# Patient Record
Sex: Female | Born: 1995 | Race: White | Hispanic: No | Marital: Single | Smoking: Never smoker
Health system: Southern US, Community
[De-identification: ages and names within clinical notes are randomized; demographics above are authoritative.]

## PROBLEM LIST (undated history)

## (undated) DIAGNOSIS — A4902 Methicillin resistant Staphylococcus aureus infection, unspecified site: Secondary | ICD-10-CM

## (undated) DIAGNOSIS — F419 Anxiety disorder, unspecified: Secondary | ICD-10-CM

## (undated) DIAGNOSIS — IMO0001 Reserved for inherently not codable concepts without codable children: Secondary | ICD-10-CM

## (undated) DIAGNOSIS — F329 Major depressive disorder, single episode, unspecified: Secondary | ICD-10-CM

## (undated) DIAGNOSIS — F909 Attention-deficit hyperactivity disorder, unspecified type: Secondary | ICD-10-CM

## (undated) DIAGNOSIS — F840 Autistic disorder: Secondary | ICD-10-CM

## (undated) DIAGNOSIS — K219 Gastro-esophageal reflux disease without esophagitis: Secondary | ICD-10-CM

## (undated) HISTORY — PX: MASTOIDECTOMY: SHX711

## (undated) HISTORY — DX: Gastro-esophageal reflux disease without esophagitis: K21.9

## (undated) HISTORY — DX: Autistic disorder: F84.0

## (undated) HISTORY — DX: Attention-deficit hyperactivity disorder, unspecified type: F90.9

## (undated) HISTORY — DX: Reserved for inherently not codable concepts without codable children: IMO0001

## (undated) HISTORY — DX: Anxiety disorder, unspecified: F41.9

## (undated) HISTORY — DX: Major depressive disorder, single episode, unspecified: F32.9

## (undated) HISTORY — DX: Methicillin resistant Staphylococcus aureus infection, unspecified site: A49.02

---
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
## 2017-01-21 ENCOUNTER — Ambulatory Visit (INDEPENDENT_AMBULATORY_CARE_PROVIDER_SITE_OTHER): Payer: Medicaid Other | Admitting: Family Medicine

## 2017-01-21 ENCOUNTER — Encounter: Payer: Self-pay | Admitting: Family Medicine

## 2017-01-21 VITALS — BP 110/80 | Temp 98.2°F | Ht 61.5 in | Wt 152.0 lb

## 2017-01-21 DIAGNOSIS — R5383 Other fatigue: Secondary | ICD-10-CM

## 2017-01-21 DIAGNOSIS — G43109 Migraine with aura, not intractable, without status migrainosus: Secondary | ICD-10-CM

## 2017-01-21 DIAGNOSIS — Z23 Encounter for immunization: Secondary | ICD-10-CM

## 2017-01-21 DIAGNOSIS — B351 Tinea unguium: Secondary | ICD-10-CM

## 2017-01-21 DIAGNOSIS — Z79899 Other long term (current) drug therapy: Secondary | ICD-10-CM | POA: Diagnosis not present

## 2017-01-21 MED ORDER — SUMATRIPTAN SUCCINATE 50 MG PO TABS: ORAL_TABLET | ORAL | 8 refills | Status: DC

## 2017-01-21 NOTE — Patient Instructions (Signed)
Migraine Headache A migraine headache is an intense, throbbing pain on one side or both sides of the head. Migraines may also cause other symptoms, such as nausea, vomiting, and sensitivity to light and noise. What are the causes? Doing or taking certain things may also trigger migraines, such as:  Alcohol.  Smoking.  Medicines, such as: ? Medicine used to treat chest pain (nitroglycerine). ? Birth control pills. ? Estrogen pills. ? Certain blood pressure medicines.  Aged cheeses, chocolate, or caffeine.  Foods or drinks that contain nitrates, glutamate, aspartame, or tyramine.  Physical activity.  Other things that may trigger a migraine include:  Menstruation.  Pregnancy.  Hunger.  Stress, lack of sleep, too much sleep, or fatigue.  Weather changes.  What increases the risk? The following factors may make you more likely to experience migraine headaches:  Age. Risk increases with age.  Family history of migraine headaches.  Being Caucasian.  Depression and anxiety.  Obesity.  Being a woman.  Having a hole in the heart (patent foramen ovale) or other heart problems.  What are the signs or symptoms? The main symptom of this condition is pulsating or throbbing pain. Pain may:  Happen in any area of the head, such as on one side or both sides.  Interfere with daily activities.  Get worse with physical activity.  Get worse with exposure to bright lights or loud noises.  Other symptoms may include:  Nausea.  Vomiting.  Dizziness.  General sensitivity to bright lights, loud noises, or smells.  Before you get a migraine, you may get warning signs that a migraine is developing (aura). An aura may include:  Seeing flashing lights or having blind spots.  Seeing bright spots, halos, or zigzag lines.  Having tunnel vision or blurred vision.  Having numbness or a tingling feeling.  Having trouble talking.  Having muscle weakness.  How is this  diagnosed? A migraine headache can be diagnosed based on:  Your symptoms.  A physical exam.  Tests, such as CT scan or MRI of the head. These imaging tests can help rule out other causes of headaches.  Taking fluid from the spine (lumbar puncture) and analyzing it (cerebrospinal fluid analysis, or CSF analysis).  How is this treated? A migraine headache is usually treated with medicines that:  Relieve pain.  Relieve nausea.  Prevent migraines from coming back.  Treatment may also include:  Acupuncture.  Lifestyle changes like avoiding foods that trigger migraines.  Follow these instructions at home: Medicines  Take over-the-counter and prescription medicines only as told by your health care provider.  Do not drive or use heavy machinery while taking prescription pain medicine.  To prevent or treat constipation while you are taking prescription pain medicine, your health care provider may recommend that you: ? Drink enough fluid to keep your urine clear or pale yellow. ? Take over-the-counter or prescription medicines. ? Eat foods that are high in fiber, such as fresh fruits and vegetables, whole grains, and beans. ? Limit foods that are high in fat and processed sugars, such as fried and sweet foods. Lifestyle  Avoid alcohol use.  Do not use any products that contain nicotine or tobacco, such as cigarettes and e-cigarettes. If you need help quitting, ask your health care provider.  Get at least 8 hours of sleep every night.  Limit your stress. General instructions   Keep a journal to find out what may trigger your migraine headaches. For example, write down: ? What you eat and   drink. ? How much sleep you get. ? Any change to your diet or medicines.  If you have a migraine: ? Avoid things that make your symptoms worse, such as bright lights. ? It may help to lie down in a dark, quiet room. ? Do not drive or use heavy machinery. ? Ask your health care provider  what activities are safe for you while you are experiencing symptoms.  Keep all follow-up visits as told by your health care provider. This is important. Contact a health care provider if:  You develop symptoms that are different or more severe than your usual migraine symptoms. Get help right away if:  Your migraine becomes severe.  You have a fever.  You have a stiff neck.  You have vision loss.  Your muscles feel weak or like you cannot control them.  You start to lose your balance often.  You develop trouble walking.  You faint. This information is not intended to replace advice given to you by your health care provider. Make sure you discuss any questions you have with your health care provider. Document Released: 03/25/2005 Document Revised: 10/13/2015 Document Reviewed: 09/11/2015 Elsevier Interactive Patient Education  2017 [employer]   

## 2017-01-21 NOTE — Progress Notes (Signed)
   Subjective:    Patient ID: Rebekah Haynes, female    DOB: 1995/10/06, 21 y.o.   MRN: [id number]  Migraine   This is a new problem. The current episode started more than 1 month ago. The problem occurs intermittently. The problem has been waxing and waning. The pain is located in the bilateral region. The pain does not radiate. The pain quality is not similar to prior headaches. The quality of the pain is described as throbbing. The pain is at a severity of 7/10. The pain is moderate. Associated symptoms include nausea, phonophobia and photophobia. Pertinent negatives include no abdominal pain, vomiting or weakness. The symptoms are aggravated by noise. She has tried acetaminophen for the symptoms. The treatment provided mild relief.   Patient here today report her toenails have fungus on them and need treatment. Has put the otc antifungal creams on it and another otc medication that she painted on the nails, she states it was helping,but she ran out of medication. Her Grandmother is with her today and states the toe nails look better.   Patient also with significant fatigue tiredness denies any high fever chills sweats denies nausea vomiting diarrhea. 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  Gastrointestinal: Positive for nausea. Negative for abdominal pain and vomiting.  Neurological: Negative for weakness.   25 minutes was spent with the patient. Greater than half the time was spent in discussion and answering questions and counseling regarding the issues that the patient came in for today.     Objective:   Physical Exam  Constitutional: She appears well-developed and well-nourished. No distress.  HENT:  Head: Normocephalic and atraumatic.  Eyes: Right eye exhibits no discharge. Left eye exhibits no discharge.  Neck: No tracheal deviation  present.  Cardiovascular: Normal rate, regular rhythm and normal heart sounds.   No murmur heard. Pulmonary/Chest: Effort normal and breath sounds normal. No respiratory distress. She has no wheezes. She has no rales.  Musculoskeletal: She exhibits no edema.  Lymphadenopathy:    She has no cervical adenopathy.  Neurological: She is alert. She exhibits normal muscle tone.  Skin: Skin is warm and dry. No erythema.  Psychiatric: Her behavior is normal.  Vitals reviewed.   She does have toenail fungus noted. Neurologically she is normal      Assessment & Plan:  Migraines frequent with are recommend Imitrex on a regular basis if ongoing troubles follow-up recommend follow-up in several weeks with headache calendar  Significant fatigue tiredness I think it's related to her age recommend patient set a good schedule check lab work await the results  Patient does have what appears to be fungus and her nails I recommend that she make some clippings and send those in

## 2017-01-22 LAB — BASIC METABOLIC PANEL
BUN / CREAT RATIO: 16 (ref 9–23)
BUN: 11 mg/dL (ref 6–20)
CO2: 22 mmol/L (ref 20–29)
CREATININE: 0.7 mg/dL (ref 0.57–1.00)
Calcium: 9.4 mg/dL (ref 8.7–10.2)
Chloride: 100 mmol/L (ref 96–106)
GFR, EST AFRICAN AMERICAN: 145 mL/min/{1.73_m2} (ref >59)
GFR, EST NON AFRICAN AMERICAN: 126 mL/min/{1.73_m2} (ref >59)
Glucose: 99 mg/dL (ref 65–99)
Potassium: 4.7 mmol/L (ref 3.5–5.2)
SODIUM: 139 mmol/L (ref 134–144)

## 2017-01-22 LAB — CBC WITH DIFFERENTIAL/PLATELET
BASOS ABS: 0 10*3/uL (ref 0.0–0.2)
Basos: 0 %
EOS (ABSOLUTE): 0.3 10*3/uL (ref 0.0–0.4)
Eos: 3 %
HEMOGLOBIN: 12.9 g/dL (ref 11.1–15.9)
Hematocrit: 38.5 % (ref 34.0–46.6)
IMMATURE GRANS (ABS): 0 10*3/uL (ref 0.0–0.1)
Immature Granulocytes: 0 %
LYMPHS ABS: 2.9 10*3/uL (ref 0.7–3.1)
LYMPHS: 24 %
MCH: 27.2 pg (ref 26.6–33.0)
MCHC: 33.5 g/dL (ref 31.5–35.7)
MCV: 81 fL (ref 79–97)
MONOCYTES: 7 %
Monocytes Absolute: 0.8 10*3/uL (ref 0.1–0.9)
Neutrophils Absolute: 7.9 10*3/uL — ABNORMAL HIGH (ref 1.4–7.0)
Neutrophils: 66 %
Platelets: 425 10*3/uL — ABNORMAL HIGH (ref 150–379)
RBC: 4.75 x10E6/uL (ref 3.77–5.28)
RDW: 13.2 % (ref 12.3–15.4)
WBC: 11.9 10*3/uL — AB (ref 3.4–10.8)

## 2017-01-22 LAB — HEPATIC FUNCTION PANEL
ALK PHOS: 90 IU/L (ref 39–117)
ALT: 17 IU/L (ref 0–32)
AST: 14 IU/L (ref 0–40)
Albumin: 4.6 g/dL (ref 3.5–5.5)
BILIRUBIN, DIRECT: 0.08 mg/dL (ref 0.00–0.40)
Bilirubin Total: <0.2 mg/dL (ref 0.0–1.2)
Total Protein: 7.6 g/dL (ref 6.0–8.5)

## 2017-01-22 LAB — TSH: TSH: 1.66 u[IU]/mL (ref 0.450–4.500)

## 2017-01-22 LAB — T4, FREE: FREE T4: 1.13 ng/dL (ref 0.93–1.60)

## 2017-01-22 NOTE — Progress Notes (Signed)
Headache logs mailed to patient

## 2017-02-17 ENCOUNTER — Telehealth: Payer: Self-pay | Admitting: Family Medicine

## 2017-02-17 DIAGNOSIS — D72829 Elevated white blood cell count, unspecified: Secondary | ICD-10-CM

## 2017-02-17 NOTE — Telephone Encounter (Signed)
Based on her previous lab work repeat CBC is all she needs thank you

## 2017-02-17 NOTE — Telephone Encounter (Signed)
Mom called stating that they was told that the pt needed labs done before her appt. Last labs per epic were: tsh,t4, hepatic,bmp,and cbc on 01/21/17.

## 2017-02-18 ENCOUNTER — Ambulatory Visit: Payer: Medicaid Other | Admitting: Family Medicine

## 2017-02-18 NOTE — Telephone Encounter (Signed)
Spoke with patient's mother and informed her per Dr.Scott Luking- a repeat CBC is the only lab work that she will need. Patient's mother verbalized understanding.

## 2017-02-20 LAB — CBC WITH DIFFERENTIAL/PLATELET
BASOS: 0 %
Basophils Absolute: 0 10*3/uL (ref 0.0–0.2)
EOS (ABSOLUTE): 0.1 10*3/uL (ref 0.0–0.4)
EOS: 2 %
HEMATOCRIT: 39.6 % (ref 34.0–46.6)
Hemoglobin: 12.9 g/dL (ref 11.1–15.9)
Immature Grans (Abs): 0 10*3/uL (ref 0.0–0.1)
Immature Granulocytes: 0 %
LYMPHS ABS: 2.1 10*3/uL (ref 0.7–3.1)
Lymphs: 27 %
MCH: 27.6 pg (ref 26.6–33.0)
MCHC: 32.6 g/dL (ref 31.5–35.7)
MCV: 85 fL (ref 79–97)
Monocytes Absolute: 0.7 10*3/uL (ref 0.1–0.9)
Monocytes: 9 %
Neutrophils Absolute: 4.9 10*3/uL (ref 1.4–7.0)
Neutrophils: 62 %
Platelets: 452 10*3/uL — ABNORMAL HIGH (ref 150–379)
RBC: 4.67 x10E6/uL (ref 3.77–5.28)
RDW: 13.4 % (ref 12.3–15.4)
WBC: 7.8 10*3/uL (ref 3.4–10.8)

## 2017-03-06 ENCOUNTER — Ambulatory Visit (INDEPENDENT_AMBULATORY_CARE_PROVIDER_SITE_OTHER): Payer: Medicaid Other | Admitting: Family Medicine

## 2017-03-06 ENCOUNTER — Encounter: Payer: Self-pay | Admitting: Family Medicine

## 2017-03-06 VITALS — BP 118/78 | Ht 61.5 in | Wt 154.8 lb

## 2017-03-06 DIAGNOSIS — B351 Tinea unguium: Secondary | ICD-10-CM | POA: Diagnosis not present

## 2017-03-06 DIAGNOSIS — Z79899 Other long term (current) drug therapy: Secondary | ICD-10-CM

## 2017-03-06 DIAGNOSIS — G43109 Migraine with aura, not intractable, without status migrainosus: Secondary | ICD-10-CM

## 2017-03-06 MED ORDER — TERBINAFINE HCL 250 MG PO TABS: 250.0000 mg | ORAL_TABLET | Freq: Every day | ORAL | 2 refills | Status: DC

## 2017-03-06 NOTE — Progress Notes (Signed)
   Subjective:    Patient ID: Rebekah Haynes, female    DOB: 02/25/1996, 21 y.o.   MRN: [id number]  HPI  Patient arrives for a follow up on toe nail fungus and to discuss results of recent culture. This patient has toenail fungus Preliminary culture shows fungus Final culture not back yet She also had leukocytosis but this is gone away with a follow-up CBC Her migraines are less frequent only occur once every several weeks Imitrex seems to help it will make the pain worse initially but makes it go away faster she denies any other particular troubles Review of Systems    Negative for cough congestion fever chills sweats vomiting diarrhea Objective:   Physical Exam Lungs are clear heart regular toenail fungus noted  Long discussion greater than half was spent discussing treatment options and answering questions greater than half time spent in discussion Discussion was held with the patient regarding the potential side effects of Lamisil including liver failure and liver toxicity.    Assessment & Plan:  Toenail fungus after long discussion patient does decide to use Lamisil 250 mg 1 daily for the next 12 weeks.  She will check a liver profile 6 weeks into treatment she had a recent liver profile which was normal  Leukocytosis resolved  Migraines under good control using Imitrex on a as needed basis if she needs to she can try 100 mg Imitrex if that works better we will use that she should follow-up with us again by spring time

## 2017-03-06 NOTE — Progress Notes (Deleted)
Subjective:     Patient ID: Rebekah Haynes, female   DOB: 06/03/1995, 21 y.o.   MRN: [id number]  HPI   Review of Systems     Objective:   Physical Exam     Assessment:     ***    Plan:     ***

## 2017-03-11 LAB — FUNGUS CULTURE W SMEAR

## 2017-04-19 LAB — HEPATIC FUNCTION PANEL
ALT: 16 IU/L (ref 0–32)
AST: 19 IU/L (ref 0–40)
Albumin: 4.4 g/dL (ref 3.5–5.5)
Alkaline Phosphatase: 81 IU/L (ref 39–117)
BILIRUBIN, DIRECT: 0.05 mg/dL (ref 0.00–0.40)
Total Protein: 7.5 g/dL (ref 6.0–8.5)

## 2017-04-20 ENCOUNTER — Encounter: Payer: Self-pay | Admitting: Family Medicine

## 2017-07-07 ENCOUNTER — Encounter: Payer: Self-pay | Admitting: Family Medicine

## 2017-07-07 ENCOUNTER — Ambulatory Visit (INDEPENDENT_AMBULATORY_CARE_PROVIDER_SITE_OTHER): Payer: Medicaid Other | Admitting: Family Medicine

## 2017-07-07 VITALS — BP 120/88 | Ht 61.5 in | Wt 155.1 lb

## 2017-07-07 DIAGNOSIS — H60311 Diffuse otitis externa, right ear: Secondary | ICD-10-CM

## 2017-07-07 DIAGNOSIS — H6121 Impacted cerumen, right ear: Secondary | ICD-10-CM

## 2017-07-07 MED ORDER — NEOMYCIN-POLYMYXIN-HC 3.5-10000-1 OT SOLN: 4.0000 [drp] | Freq: Four times a day (QID) | OTIC | 0 refills | Status: DC

## 2017-07-07 NOTE — Progress Notes (Signed)
   Subjective:    Patient ID: Rebekah Haynes, female    DOB: 02/18/1996, 22 y.o.   MRN: [id number]  HPI Patient is here today with complaints of right ear pain and headache. States started two - three days ago with itching. Taking Advil for the pain which has helped. Patient recently went to the beach did not do any swimming she denies any head congestion drainage coughing wheezing difficulty breathing states the headache from the ear pain was bad enough to be uncomfortable during the day  Review of Systems  Constitutional: Negative for activity change and fever.  HENT: Positive for ear pain. Negative for congestion and rhinorrhea.   Eyes: Negative for discharge.  Respiratory: Negative for cough, shortness of breath and wheezing.   Cardiovascular: Negative for chest pain.       Objective:   Physical Exam  Constitutional: She appears well-developed.  HENT:  Head: Normocephalic.  Right Ear: External ear normal.  Left Ear: External ear normal.  Nose: Nose normal.  Mouth/Throat: Oropharynx is clear and moist. No oropharyngeal exudate.  Eyes: Right eye exhibits no discharge. Left eye exhibits no discharge.  Neck: Neck supple. No tracheal deviation present.  Cardiovascular: Normal rate and normal heart sounds.  No murmur heard. Pulmonary/Chest: Effort normal and breath sounds normal. She has no wheezes. She has no rales.  Lymphadenopathy:    She has no cervical adenopathy.  Skin: Skin is warm and dry.  Nursing note and vitals reviewed.         Assessment & Plan:  Right otitis externa Cerumen impaction Referral to ENT for cerumen removal Cortisporin otic drops as directed Follow-up if progressive troubles or worse

## 2018-04-30 ENCOUNTER — Ambulatory Visit (INDEPENDENT_AMBULATORY_CARE_PROVIDER_SITE_OTHER): Payer: Medicaid Other | Admitting: Psychiatry

## 2018-04-30 ENCOUNTER — Encounter (HOSPITAL_COMMUNITY): Payer: Self-pay | Admitting: Psychiatry

## 2018-04-30 ENCOUNTER — Encounter (INDEPENDENT_AMBULATORY_CARE_PROVIDER_SITE_OTHER): Payer: Self-pay

## 2018-04-30 DIAGNOSIS — F840 Autistic disorder: Secondary | ICD-10-CM | POA: Diagnosis not present

## 2018-04-30 DIAGNOSIS — F909 Attention-deficit hyperactivity disorder, unspecified type: Secondary | ICD-10-CM | POA: Insufficient documentation

## 2018-04-30 DIAGNOSIS — F900 Attention-deficit hyperactivity disorder, predominantly inattentive type: Secondary | ICD-10-CM | POA: Diagnosis not present

## 2018-04-30 MED ORDER — ATOMOXETINE HCL 40 MG PO CAPS: ORAL_CAPSULE | ORAL | 2 refills | Status: DC

## 2018-04-30 MED ORDER — GUANFACINE HCL ER 1 MG PO TB24: ORAL_TABLET | ORAL | 1 refills | Status: DC

## 2018-04-30 NOTE — Progress Notes (Signed)
Psychiatric Initial Adult Assessment   Patient Identification: Rebekah Haynes MRN:  [id number] Date of Evaluation:  04/30/2018 Referral Source:self Chief Complaint:   Chief Complaint    ADHD; Establish Care     Visit Diagnosis:    ICD-10-CM   1. Attention deficit hyperactivity disorder (ADHD), predominantly inattentive type F90.0   2. Autistic spectrum disorder F84.0     History of Present Illness: This patient is a 23 year old single white female who lives with her mother stepfather and 31 year old sister in [place].  She only sees her father very rarely.  She works part-time in the [employer].  She is on disability for autistic spectrum disorder and ADHD.  Her mother is currently her payee.  The patient and mother are self-referred.  The patient went to the psychiatric and counseling center in [place] for many years and saw Dr. [name].  She was originally diagnosed with ADHD as a very young child and later in high school with autistic spectrum disorder.  In the past she had been on medications to help with focus and impulsivity such as Strattera and Intuniv.  She had a lapse in her Medicaid and is now back on it after 3 years and would like to return to using the medications.  The patient presents today with her mother.  The mother states that her pregnancy with this patient was normal and she did not use drugs alcohol or medications.  The patient was born full-term without difficulty she was a difficult colicky baby who cried a lot and was hard to soothe.  Her milestones in terms of walking and gross motor skills were normal but her speech was quite garbled and she really did not put words together until approximately age 89.  She had speech therapy in elementary school as well as occupational therapy because of poor fine motor skills.  According to mother she did not have cognitive delays.  She was always a child to stay to her self and and did not interact much with other  people.  She is always had difficulty with transitions and did not like crowds or noises and does best when things are predictable and the same  The patient was tried on stimulants when she was young because she was very hyperactive over talkative and disruptive in class.  The stimulants made her "too shut down" and she was switched to Strattera which she stayed out all the way through high school.  She was also on intuniv which seemed to help her impulsivity.  She has been off these medications for 3 years due to insurance lapse.  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  Her goal is to eventually build computers but she does not seem to enthusiastic about going back into college full-time.  She spends most of her time playing video games watching Annamae.  All of her friends are online.  She does not use drugs alcohol cigarettes and is not sexually active.  She denies depression or anxiety currently.  The mother states that she and her husband would like to see the patient become more independent.  The patient has worked with vocational rehab and they have done some testing and they helped her get her job.  The patient has a driver's license but is too anxious  and scared to drive.  Her mother does all her money but she tends to spend too much on food.  She has gained 14 pounds in the last year.  Cognitively it seems that she could manage things on her own but she tends to make impulsive decisions.  For now I suggested that we get her back on medication and see what her level of function is and also review her testing.  Associated Signs/Symptoms: Depression Symptoms:  difficulty concentrating, (Hypo) Manic Symptoms:  Distractibility, Impulsivity, Anxiety Symptoms:  Social Anxiety, Psychotic Symptoms: PTSD Symptoms: No  history of trauma or abuse  Past Psychiatric History: Was treated by Dr. [name] in [place] for approximately 7 years with a diagnoses of ADHD and autism.  For most of this time she was on Strattera 80 mg daily and Intuniv 3 mg daily  Previous Psychotropic Medications: Yes   Substance Abuse History in the last 12 months:  No.  Consequences of Substance Abuse: Negative  Past Medical History:  Past Medical History:  Diagnosis Date  . ADHD (attention deficit hyperactivity disorder)   . Autistic spectrum disorder   . MRSA (methicillin resistant Staphylococcus aureus)   . Reflux     Past Surgical History:  Procedure Laterality Date  . MASTOIDECTOMY      Family Psychiatric History: The father has a history of substance abuse.  The mother does not know of any other history of ADHD autism or developmental disabilities or mental illness in the family  Family History:  Family History  Problem Relation Age of Onset  . Alcohol abuse Father   . Drug abuse Father     Social History:   Social History   Socioeconomic History  . Marital status: Single    Spouse name: Not on file  . Number of children: Not on file  . Years of education: Not on file  . Highest education level: Not on file  Occupational History  . Not on file  Social Needs  . Financial resource strain: Not on file  . Food insecurity:    Worry: Not on file    Inability: Not on file  . Transportation needs:    Medical: Not on file    Non-medical: Not on file  Tobacco Use  . Smoking status: Never Smoker  . Smokeless tobacco: Never Used  Substance and Sexual Activity  . Alcohol use: Never    Frequency: Never  . Drug use: Never  . Sexual activity: Never  Lifestyle  . Physical activity:    Days per week: Not on file    Minutes per session: Not on file  . Stress: Not on file  Relationships  . Social connections:    Talks on phone: Not on file    Gets together: Not on file    Attends religious service: Not  on file    Active member of club or organization: Not on file    Attends meetings of clubs or organizations: Not on file    Relationship status: Not on file  Other Topics Concern  . Not on file  Social History Narrative  . Not on file    Additional Social History:   Allergies:   Allergies  Allergen Reactions  . Clindamycin/Lincomycin     Metabolic Disorder Labs: No results found for: HGBA1C, MPG No results found for: PROLACTIN No results found for: CHOL, TRIG, HDL, CHOLHDL, VLDL, LDLCALC Lab Results  Component Value Date   TSH 1.660 01/21/2017    Therapeutic Level Labs:  No results found for: LITHIUM No results found for: CBMZ No results found for: VALPROATE  Current Medications: Current Outpatient Medications  Medication Sig Dispense Refill  . atomoxetine (STRATTERA) 40 MG capsule Take 40 mg qam for 2 weeks, then increase to 80 mg 60 capsule 2  . guanFACINE (INTUNIV) 1 MG TB24 ER tablet Start with one mg daily, gradually build up to 3 mg daily 90 tablet 1   No current facility-administered medications for this visit.     Musculoskeletal: Strength & Muscle Tone: within normal limits Gait & Station: normal Patient leans: N/A  Psychiatric Specialty Exam: Review of Systems  All other systems reviewed and are negative.   Blood pressure 125/85, pulse 84, height 5\' 1"  (1.549 m), weight 168 lb (76.2 kg), SpO2 100 %.Body mass index is 31.74 kg/m.  General Appearance: Bizarre, Casual and Fairly Groomed  Eye Contact:  Fair  Speech:  Clear and Coherent  Volume:  Normal  Mood:  Anxious and Euthymic  Affect:  Blunt and Congruent  Thought Process:  Goal Directed  Orientation:  Full (Time, Place, and Person)  Thought Content:  Rumination  Suicidal Thoughts:  No  Homicidal Thoughts:  No  Memory:  Immediate;   Good Recent;   Good Remote;   Fair  Judgement:  Fair  Insight:  Shallow  Psychomotor Activity:  Normal  Concentration:  Concentration: Poor and Attention  Span: Poor  Recall:  FiservFair  Fund of Knowledge:Good  Language: Good  Akathisia:  No  Handed:  Right  AIMS (if indicated):  not done  Assets:  Communication Skills Desire for Improvement Physical Health Resilience Social Support Talents/Skills  ADL's:  Intact  Cognition: WNL  Sleep:  Good   Screenings: PHQ2-9     Office Visit [facility] 03/06/2017 in [facility]  PHQ-2 Total Score  0      Assessment and Plan: Patient is a 23 year old female with a history of ADHD and Asperger syndrome.  She has been off her medication for 3 years.  She does have some definite strengths such as her cognitive abilities and interest in computers.  However she tends to be very disorganized set in her ways and afraid of change.  She still does not seem quite able to live on her own or make her own decisions nor does she seem very motivated to want this.  We will review her testing to see where she is at cognitively.  In the meantime she will restart Strattera at 40 mg and gradually work up to 80 mg daily and also restart Intuniv at 1 mg and gradually work up to 3 mg daily over the next couple of weeks.  She will return to see me in 4 weeks   Diannia Rudereborah Ross, MD 1/23/20209:59 AM

## 2018-05-28 ENCOUNTER — Ambulatory Visit (INDEPENDENT_AMBULATORY_CARE_PROVIDER_SITE_OTHER): Payer: Medicaid Other | Admitting: Psychiatry

## 2018-05-28 ENCOUNTER — Encounter (HOSPITAL_COMMUNITY): Payer: Self-pay | Admitting: Psychiatry

## 2018-05-28 VITALS — BP 117/84 | HR 82 | Ht 61.0 in | Wt 164.0 lb

## 2018-05-28 DIAGNOSIS — F840 Autistic disorder: Secondary | ICD-10-CM

## 2018-05-28 DIAGNOSIS — F900 Attention-deficit hyperactivity disorder, predominantly inattentive type: Secondary | ICD-10-CM | POA: Diagnosis not present

## 2018-05-28 MED ORDER — ATOMOXETINE HCL 80 MG PO CAPS: 80.0000 mg | ORAL_CAPSULE | Freq: Every day | ORAL | 2 refills | Status: DC

## 2018-05-28 MED ORDER — GUANFACINE HCL ER 1 MG PO TB24: ORAL_TABLET | ORAL | 2 refills | Status: DC

## 2018-05-28 NOTE — Progress Notes (Signed)
BH MD/PA/NP OP Progress Note  05/28/2018 9:24 AM Rebekah Haynes  MRN:  [id number]  Chief Complaint:  Chief Complaint    ADHD; Follow-up     HPI: This patient is a 23 year old single white female who lives with her mother stepfather and 31 year old sister in [place].  She only sees her father very rarely.  She works part-time in the [employer].  She is on disability for autistic spectrum disorder and ADHD.  Her mother is currently her payee.  The patient and mother are self-referred.  The patient went to the psychiatric and counseling center in [place] for many years and saw Dr. [name].  She was originally diagnosed with ADHD as a very young child and later in high school with autistic spectrum disorder.  In the past she had been on medications to help with focus and impulsivity such as Strattera and Intuniv.  She had a lapse in her Medicaid and is now back on it after 3 years and would like to return to using the medications.  The patient presents today with her mother.  The mother states that her pregnancy with this patient was normal and she did not use drugs alcohol or medications.  The patient was born full-term without difficulty she was a difficult colicky baby who cried a lot and was hard to soothe.  Her milestones in terms of walking and gross motor skills were normal but her speech was quite garbled and she really did not put words together until approximately age 6.  She had speech therapy in elementary school as well as occupational therapy because of poor fine motor skills.  According to mother she did not have cognitive delays.  She was always a child to stay to her self and and did not interact much with other people.  She is always had difficulty with transitions and did not like crowds or noises and does best when things are predictable and the same  The patient was tried on stimulants when she was young because she was very hyperactive over talkative and disruptive  in class.  The stimulants made her "too shut down" and she was switched to Strattera which she stayed out all the way through high school.  She was also on intuniv which seemed to help her impulsivity.  She has been off these medications for 3 years due to insurance lapse.  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  Her goal is to eventually build computers but she does not seem to enthusiastic about going back into college full-time.  She spends most of her time playing video games watching Annamae.  All of her friends are online.  She does not use drugs alcohol cigarettes and is not sexually active.  She denies depression or anxiety currently.  The mother states that she and her husband would like to see the patient become more independent.  The patient has worked with vocational rehab and they have done some testing and they helped her get her job.  The patient has a driver's license but is too anxious and scared to drive.  Her mother does all her money but she tends to spend too much on food.  She has gained 14 pounds in the last year.  Cognitively it seems that she could manage  things on her own but she tends to make impulsive decisions.  For now I suggested that we get her back on medication and see what her level of function is and also review her testing.  The patient and mom return after 4 weeks.  The patient is now taking Strattera 40 mg and Intuniv 1 mg.  Initially she was drowsy from the medication but now it has worn off.  She and her mom have not seen a whole lot of difference.  The mom states however when she was on it before after a point she was more focused and less impulsive.  We still have to probably give it more time and try to get the dosages increased.  The patient continues to work and do fairly well at her  job.  She has taken on more responsibilities with household chores.  Her affect is bright and she is pleasant and cooperative.  I did receive her past testing and indicated average IQ with some problems with written expression.  Her adaptive skills were fairly low. Visit Diagnosis:    ICD-10-CM   1. Attention deficit hyperactivity disorder (ADHD), predominantly inattentive type F90.0   2. Autistic spectrum disorder F84.0     Past Psychiatric History: Patient was treated by Dr. Yetta BarreJones in [place] for approximately 7 years with a diagnosis of ADHD and autism.  For most of this time she was on Strattera 80 mg daily and Intuniv 3 mg daily  Past Medical History:  Past Medical History:  Diagnosis Date  . ADHD (attention deficit hyperactivity disorder)   . Autistic spectrum disorder   . MRSA (methicillin resistant Staphylococcus aureus)   . Reflux     Past Surgical History:  Procedure Laterality Date  . MASTOIDECTOMY      Family Psychiatric History: See below  Family History:  Family History  Problem Relation Age of Onset  . Alcohol abuse Father   . Drug abuse Father     Social History:  Social History   Socioeconomic History  . Marital status: Single    Spouse name: Not on file  . Number of children: Not on file  . Years of education: Not on file  . Highest education level: Not on file  Occupational History  . Not on file  Social Needs  . Financial resource strain: Not on file  . Food insecurity:    Worry: Not on file    Inability: Not on file  . Transportation needs:    Medical: Not on file    Non-medical: Not on file  Tobacco Use  . Smoking status: Never Smoker  . Smokeless tobacco: Never Used  Substance and Sexual Activity  . Alcohol use: Never    Frequency: Never  . Drug use: Never  . Sexual activity: Never  Lifestyle  . Physical activity:    Days per week: Not on file    Minutes per session: Not on file  . Stress: Not on file  Relationships  . Social  connections:    Talks on phone: Not on file    Gets together: Not on file    Attends religious service: Not on file    Active member of club or organization: Not on file    Attends meetings of clubs or organizations: Not on file    Relationship status: Not on file  Other Topics Concern  . Not on file  Social History Narrative  . Not on file    Allergies:  Allergies  Allergen  Reactions  . Clindamycin/Lincomycin     Metabolic Disorder Labs: No results found for: HGBA1C, MPG No results found for: PROLACTIN No results found for: CHOL, TRIG, HDL, CHOLHDL, VLDL, LDLCALC Lab Results  Component Value Date   TSH 1.660 01/21/2017    Therapeutic Level Labs: No results found for: LITHIUM No results found for: VALPROATE No components found for:  CBMZ  Current Medications: Current Outpatient Medications  Medication Sig Dispense Refill  . atomoxetine (STRATTERA) 80 MG capsule Take 1 capsule (80 mg total) by mouth daily. 30 capsule 2  . guanFACINE (INTUNIV) 1 MG TB24 ER tablet Start with one mg daily, gradually build up to 3 mg daily 90 tablet 2   No current facility-administered medications for this visit.      Musculoskeletal: Strength & Muscle Tone: within normal limits Gait & Station: normal Patient leans: N/A  Psychiatric Specialty Exam: Review of Systems  All other systems reviewed and are negative.   Blood pressure 117/84, pulse 82, height 5\' 1"  (1.549 m), weight 164 lb (74.4 kg), SpO2 98 %.Body mass index is 30.99 kg/m.  General Appearance: Casual and Fairly Groomed  Eye Contact:  Fair  Speech:  Clear and Coherent  Volume:  Normal  Mood:  Euthymic  Affect:  Congruent  Thought Process:  Goal Directed  Orientation:  Full (Time, Place, and Person)  Thought Content: WDL   Suicidal Thoughts:  No  Homicidal Thoughts:  No  Memory:  Immediate;   Good Recent;   Good Remote;   Fair  Judgement:  Fair  Insight:  Shallow  Psychomotor Activity:  Normal  Concentration:   Concentration: Fair and Attention Span: Fair  Recall:  Good  Fund of Knowledge: Fair  Language: Good  Akathisia:  No  Handed:  Right  AIMS (if indicated): not done  Assets:  Communication Skills Desire for Improvement Physical Health Resilience Social Support Talents/Skills  ADL's:  Intact  Cognition: WNL  Sleep:  Good   Screenings: PHQ2-9     Office Visit from 03/06/2017 in [facility]  PHQ-2 Total Score  0       Assessment and Plan:   This patient is a 23 year old female with a history of autistic spectrum disorder and ADHD.  She is slowly uptitrating her medication.  We will go up to Strattera to 80 mg daily for focus and gradually increase Intuniv to 3 mg daily for impulsivity.  She will return to see me in 2 months Diannia Ruder, MD 05/28/2018, 9:24 AM

## 2018-07-13 ENCOUNTER — Ambulatory Visit (INDEPENDENT_AMBULATORY_CARE_PROVIDER_SITE_OTHER): Payer: Medicaid Other | Admitting: Family Medicine

## 2018-07-13 ENCOUNTER — Other Ambulatory Visit: Payer: Self-pay

## 2018-07-13 DIAGNOSIS — H60311 Diffuse otitis externa, right ear: Secondary | ICD-10-CM | POA: Diagnosis not present

## 2018-07-13 MED ORDER — NEOMYCIN-POLYMYXIN-HC 3.5-10000-1 OT SOLN: OTIC | 0 refills | Status: DC

## 2018-07-13 MED ORDER — CEFPROZIL 500 MG PO TABS: 500.0000 mg | ORAL_TABLET | Freq: Two times a day (BID) | ORAL | 0 refills | Status: DC

## 2018-07-13 NOTE — Progress Notes (Signed)
   Subjective:    Patient ID: Rebekah Haynes, female    DOB: 07/20/1995, 23 y.o.   MRN: [id number]  Otalgia   There is pain in the right ear. This is a new problem. Episode onset: 24 - 48 hours ago. Associated symptoms include ear discharge. She has tried NSAIDs for the symptoms. The treatment provided mild relief.   Virtual Visit via Telephone Note  I connected with Jeffrie R Mcaulay on 07/13/18 at 11:00 AM EDT by telephone and verified that I am speaking with the correct person using two identifiers.   I discussed the limitations, risks, security and privacy concerns of performing an evaluation and management service by telephone and the availability of in person appointments. I also discussed with the patient that there may be a patient responsible charge related to this service. The patient expressed understanding and agreed to proceed.   History of Present Illness:    Observations/Objective:   Assessment and Plan:   Follow Up Instructions:    I discussed the assessment and treatment plan with the patient. The patient was provided an opportunity to ask questions and all were answered. The patient agreed with the plan and demonstrated an understanding of the instructions.   The patient was advised to call back or seek an in-person evaluation if the symptoms worsen or if the condition fails to improve as anticipated.  I provided of non-face-to-face time during this encounter.   Patient has history of external otitis  No recent cough cold congestion drainage  No fever  No recent swimming.  Review of Systems  HENT: Positive for ear discharge and ear pain.        Objective:   Physical Exam  Some pain when pulling on earlobe no acute distress     Assessment & Plan:  Impression external otitis versus otitis media discussed.  Likely external.  Will cover with both oral antibiotics and drops.  Symptom care discussed warning signs discussed

## 2018-07-27 ENCOUNTER — Ambulatory Visit (HOSPITAL_COMMUNITY): Payer: Medicaid Other | Admitting: Psychiatry

## 2018-08-03 ENCOUNTER — Ambulatory Visit (INDEPENDENT_AMBULATORY_CARE_PROVIDER_SITE_OTHER): Payer: Medicaid Other | Admitting: Psychiatry

## 2018-08-03 ENCOUNTER — Other Ambulatory Visit: Payer: Self-pay

## 2018-08-03 ENCOUNTER — Encounter (HOSPITAL_COMMUNITY): Payer: Self-pay | Admitting: Psychiatry

## 2018-08-03 DIAGNOSIS — F840 Autistic disorder: Secondary | ICD-10-CM | POA: Diagnosis not present

## 2018-08-03 DIAGNOSIS — F900 Attention-deficit hyperactivity disorder, predominantly inattentive type: Secondary | ICD-10-CM

## 2018-08-03 MED ORDER — GUANFACINE HCL ER 1 MG PO TB24: 1.0000 mg | ORAL_TABLET | Freq: Three times a day (TID) | ORAL | 2 refills | Status: DC

## 2018-08-03 MED ORDER — ATOMOXETINE HCL 80 MG PO CAPS: 80.0000 mg | ORAL_CAPSULE | Freq: Every day | ORAL | 2 refills | Status: DC

## 2018-08-03 NOTE — Progress Notes (Signed)
Virtual Visit via Video Note  I connected with Rebekah Haynes on 08/03/18 at 10:20 AM EDT by a video enabled telemedicine application and verified that I am speaking with the correct person using two identifiers.   I discussed the limitations of evaluation and management by telemedicine and the availability of in person appointments. The patient expressed understanding and agreed to proceed.      I discussed the assessment and treatment plan with the patient. The patient was provided an opportunity to ask questions and all were answered. The patient agreed with the plan and demonstrated an understanding of the instructions.   The patient was advised to call back or seek an in-person evaluation if the symptoms worsen or if the condition fails to improve as anticipated.  I provided 15 minutes of non-face-to-face time during this encounter.   Rebekah Ruder, MD  [facility] MD/PA/NP OP Progress Note  08/03/2018 10:49 AM Rebekah Haynes  MRN:  [id number]  Chief Complaint:  Chief Complaint    ADD; Follow-up     HPI: This patient is a 23 year old single white female who lives with her mother stepfather and 90 year old sister in [place]. She only sees her father very rarely. She works part-time in the [employer]. She is on disability for autistic spectrum disorder and ADHD. Her mother is currently her payee.  The patient and mother are self-referred. The patient went to the psychiatric and counseling center in [place] for many years and saw Dr. Yetta Haynes. She was originally diagnosed with ADHD as a very young child and later in high school with autistic spectrum disorder. In the past she had been on medications to help with focus and impulsivity such as Strattera and Intuniv. She had a lapse in her Medicaid and is now back on it after 3 years and would like to return to using the medications.  The patient presents today with her mother. The mother states that her pregnancy  with this patient was normal and she did not use drugs alcohol or medications. The patient was born full-term without difficulty she was a difficult colicky baby who cried a lot and was hard to soothe. Her milestones in terms of walking and gross motor skills were normal but her speech was quite garbled and she really did not put words together until approximately age 68. She had speech therapy in elementary school as well as occupational therapy because of poor fine motor skills. According to mother she did not have cognitive delays. She was always a child to stay to her self and and did not interact much with other people. She is always had difficulty with transitions and did not like crowds or noises and does best when things are predictable and the same  The patient was tried on stimulants when she was young because she was very hyperactive over talkative and disruptive in class. The stimulants made her "too shut down" and she was switched to Strattera which she stayed out all the way through high school. She was also on intunivwhich seemed to help her impulsivity. She has been off these medications for 3 years due to insurance lapse. 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 Her goal is to eventually build computers but she does not seem to enthusiastic about going back into college full-time.  She spends most of her time playing video games watching Annamae. All of her friends are online. She does not use drugs alcohol cigarettes and is not sexually active. She denies depression or anxiety currently.  The mother states that she and her husband would like to see the patient become more independent. The patient has worked with vocational rehab and they have done some testing and they helped her get her job. The  patient has a driver's license but is too anxious and scared to drive. Her mother does all her money but she tends to spend too much on food. She has gained 14 pounds in the last year. Cognitively it seems that she could manage things on her own but she tends to make impulsive decisions. For now I suggested that we get her back on medication and see what her level of function is and also review her testing.  Patient returns for follow-up after 2 months and is seen via telemedicine due to the coronavirus pandemic.  She states that she is doing okay.  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  So far she is handling it well.  She has to wear a mask at work which she finds somewhat uncomfortable.  She is allowed to bring in her own mask or bandanna which she likes better.  She states that it is hard to focus at work because every 30 minutes you have to leave your task and clean the workstation.  She is getting along well with her family members.  She does think that Strattera Intuniv has helped her to concentrate a little bit better.  She denies being depressed or having difficulty with sleep or anxiety. Visit Diagnosis:    ICD-10-CM   1. Attention deficit hyperactivity disorder (ADHD), predominantly inattentive type F90.0   2. Autistic spectrum disorder F84.0     Past Psychiatric History: Patient was treated by Dr. Yetta BarreJones in [place] for approximately 7 years with a diagnosis of ADHD and autism.  For most of this time she was on Strattera 80 mg daily and Intuniv 3 mg daily  Past Medical History:  Past Medical History:  Diagnosis Date  . ADHD (attention deficit hyperactivity disorder)   . Autistic spectrum disorder   . MRSA (methicillin resistant Staphylococcus aureus)   . Reflux     Past Surgical History:  Procedure Laterality Date  . MASTOIDECTOMY      Family Psychiatric History: See below  Family History:  Family History  Problem Relation  Age of Onset  . Alcohol abuse Father   . Drug abuse Father     Social History:  Social History   Socioeconomic History  . Marital status: Single    Spouse name: Not on file  . Number of children: Not on file  . Years of education: Not on file  . Highest education level: Not on file  Occupational History  . Not on file  Social Needs  . Financial resource strain: Not on file  . Food insecurity:    Worry: Not on file    Inability: Not on file  . Transportation needs:    Medical: Not on file    Non-medical: Not on file  Tobacco Use  . Smoking status: Never Smoker  . Smokeless tobacco: Never Used  Substance and Sexual Activity  . Alcohol use: Never    Frequency: Never  . Drug use: Never  . Sexual activity: Never  Lifestyle  . Physical activity:    Days per week: Not on file    Minutes per session: Not on file  . Stress: Not on file  Relationships  . Social connections:    Talks on phone: Not on file    Gets together: Not on file    Attends religious service: Not on file    Active member of club or organization: Not on file    Attends meetings of clubs or organizations: Not on file    Relationship status: Not on file  Other Topics Concern  . Not on file  Social History Narrative  . Not on file    Allergies:  Allergies  Allergen Reactions  . Clindamycin/Lincomycin     Metabolic Disorder Labs: No results found for: HGBA1C, MPG No results found for: PROLACTIN No results found for: CHOL, TRIG, HDL, CHOLHDL, VLDL, LDLCALC Lab Results  Component Value Date   TSH 1.660 01/21/2017    Therapeutic Level Labs: No results found for: LITHIUM No results found for: VALPROATE No components found for:  CBMZ  Current Medications: Current Outpatient Medications  Medication Sig Dispense Refill  . atomoxetine (STRATTERA) 80 MG capsule Take 1 capsule (80 mg total) by mouth daily. 30 capsule 2  . cefPROZIL (CEFZIL) 500 MG tablet Take 1 tablet (500 mg total) by mouth 2  (two) times daily. 20 tablet 0  . guanFACINE (INTUNIV) 1 MG TB24 ER tablet Take 1 tablet (1 mg total) by mouth 3 (three) times daily. 90 tablet 2  . neomycin-polymyxin-hydrocortisone (CORTISPORIN) OTIC solution 3 -4 drops to right ear qid 10 mL 0   No current facility-administered medications for this visit.      Musculoskeletal: Strength & Muscle Tone: within normal limits Gait & Station: normal Patient leans: N/A  Psychiatric Specialty Exam: Review of Systems  All other systems reviewed and are negative.   There were no vitals taken for this visit.There is no height or weight on file to calculate BMI.  General Appearance: Casual and Fairly Groomed  Eye Contact:  Good  Speech:  Clear and Coherent  Volume:  Normal  Mood:  Euthymic  Affect:  Blunt  Thought Process:  Goal Directed  Orientation:  Full (Time, Place, and Person)  Thought Content: WDL   Suicidal Thoughts:  No  Homicidal Thoughts:  No  Memory:  Immediate;   Good Recent;   Good Remote;   Fair  Judgement:  Fair  Insight:  Shallow  Psychomotor Activity:  Normal  Concentration:  Concentration: Good and Attention Span: Good  Recall:  [employer] of Knowledge: Fair  Language: Good  Akathisia:  No  Handed:  Right  AIMS (if indicated): not done  Assets:  Communication Skills Desire for Improvement Physical Health Resilience Social Support Talents/Skills  ADL's:  Intact  Cognition: WNL  Sleep:  Good   Screenings: PHQ2-9     Office Visit from 03/06/2017 in [facility]  PHQ-2 Total Score  0       Assessment and Plan: This patient is a 23 year old female with a history of ADD and autistic disorder, high functioning.  She seems to be doing well and handling the increased work responsibilities without difficulty.  She will continue Strattera 80 mg daily as well as Intuniv 1 mg 3 times daily both for ADD.  She will return to see me in 3 months   Rebekah Ruder, MD 08/03/2018, 10:49 AM

## 2018-11-02 ENCOUNTER — Other Ambulatory Visit: Payer: Self-pay

## 2018-11-02 ENCOUNTER — Ambulatory Visit (INDEPENDENT_AMBULATORY_CARE_PROVIDER_SITE_OTHER): Payer: Medicaid Other | Admitting: Psychiatry

## 2018-11-02 ENCOUNTER — Encounter (HOSPITAL_COMMUNITY): Payer: Self-pay | Admitting: Psychiatry

## 2018-11-02 DIAGNOSIS — F840 Autistic disorder: Secondary | ICD-10-CM | POA: Diagnosis not present

## 2018-11-02 DIAGNOSIS — F900 Attention-deficit hyperactivity disorder, predominantly inattentive type: Secondary | ICD-10-CM

## 2018-11-02 MED ORDER — ATOMOXETINE HCL 80 MG PO CAPS: 80.0000 mg | ORAL_CAPSULE | Freq: Every day | ORAL | 2 refills | Status: DC

## 2018-11-02 MED ORDER — GUANFACINE HCL ER 1 MG PO TB24: 1.0000 mg | ORAL_TABLET | Freq: Three times a day (TID) | ORAL | 2 refills | Status: DC

## 2018-11-02 NOTE — Progress Notes (Signed)
Virtual Visit via Telephone Note  I connected with Rebekah Haynes on 11/02/18 at 10:00 AM EDT by telephone and verified that I am speaking with the correct person using two identifiers.   I discussed the limitations, risks, security and privacy concerns of performing an evaluation and management service by telephone and the availability of in person appointments. I also discussed with the patient that there may be a patient responsible charge related to this service. The patient expressed understanding and agreed to proceed.     I discussed the assessment and treatment plan with the patient. The patient was provided an opportunity to ask questions and all were answered. The patient agreed with the plan and demonstrated an understanding of the instructions.   The patient was advised to call back or seek an in-person evaluation if the symptoms worsen or if the condition fails to improve as anticipated.  I provided 15 minutes of non-face-to-face time during this encounter.   Levonne Spiller, MD  [facility] MD/PA/NP OP Progress Note  11/02/2018 10:22 AM Rebekah Haynes  MRN:  [id number]  Chief Complaint:  Chief Complaint    ADD; Follow-up     HPI: This patient is a 23 year old single white female who lives with her mother stepfather and 10 year old sister in [place]. She only sees her father very rarely. She works part-time in the [employer]. She is on disability for autistic spectrum disorder and ADHD. Her mother is currently her payee.  The patient and mother are self-referred. The patient went to the psychiatric and counseling center in [place] for many years and saw Dr. Ronnald Ramp. She was originally diagnosed with ADHD as a very young child and later in high school with autistic spectrum disorder. In the past she had been on medications to help with focus and impulsivity such as Strattera and Intuniv. She had a lapse in her Medicaid and is now back on it after 3 years and  would like to return to using the medications.  The patient presents today with her mother. The mother states that her pregnancy with this patient was normal and she did not use drugs alcohol or medications. The patient was born full-term without difficulty she was a difficult colicky baby who cried a lot and was hard to soothe. Her milestones in terms of walking and gross motor skills were normal but her speech was quite garbled and she really did not put words together until approximately age 23. She had speech therapy in elementary school as well as occupational therapy because of poor fine motor skills. According to mother she did not have cognitive delays. She was always a child to stay to her self and and did not interact much with other people. She is always had difficulty with transitions and did not like crowds or noises and does best when things are predictable and the same  The patient was tried on stimulants when she was young because she was very hyperactive over talkative and disruptive in class. The stimulants made her "too shut down" and she was switched to Strattera which she stayed out all the way through high school. She was also on intunivwhich seemed to help her impulsivity. She has been off these medications for 3 years due to insurance lapse. 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 Her goal is to eventually build computers but she does not seem to enthusiastic about going back into college full-time. She spends most of her time playing video games watching Annamae. All of her friends are online. She does not use drugs alcohol cigarettes and is not sexually active. She denies depression or anxiety currently.  The mother states that she and her husband would like to see the patient become  more independent. The patient has worked with vocational rehab and they have done some testing and they helped her get her job. The patient has a driver's license but is too anxious and scared to drive. Her mother does all her money but she tends to spend too much on food. She has gained 14 pounds in the last year. Cognitively it seems that she could manage things on her own but she tends to make impulsive decisions. For now I suggested that we get her back on medication and see what her level of function is and also review her testing.  The patient returns after 3 months.  She states her life is been a little bit more stressful lately.  Her cousin died about a week ago in a car accident and they have been attending the funeral etc.  When she got back to work she had a Writernew manager but she seems to be adjusting to this.  She denies being significantly depressed or anxious.  She is sleeping well.  She states that the medications continue to help her focus and stay on an even keel at work.  She is currently taking a computer course and it seems to enjoy this sort of learning. Visit Diagnosis:    ICD-10-CM   1. Attention deficit hyperactivity disorder (ADHD), predominantly inattentive type  F90.0   2. Autistic spectrum disorder  F84.0     Past Psychiatric History: The patient was treated by Dr. Yetta BarreJones in [place] for approximately 7 years with a diagnosis of ADHD and autism.  For most of this time she was on Strattera 80 mg daily and Intuniv 3 mg daily  Past Medical History:  Past Medical History:  Diagnosis Date  . ADHD (attention deficit hyperactivity disorder)   . Autistic spectrum disorder   . MRSA (methicillin resistant Staphylococcus aureus)   . Reflux     Past Surgical History:  Procedure Laterality Date  . MASTOIDECTOMY      Family Psychiatric History: see below  Family History:  Family History  Problem Relation Age of Onset  . Alcohol abuse Father   . Drug abuse Father      Social History:  Social History   Socioeconomic History  . Marital status: Single    Spouse name: Not on file  . Number of children: Not on file  . Years of education: Not on file  . Highest education level: Not on file  Occupational History  . Not on file  Social Needs  . Financial resource strain: Not on file  . Food insecurity    Worry: Not on file    Inability: Not on file  . Transportation needs    Medical: Not on file    Non-medical: Not on file  Tobacco Use  . Smoking status: Never Smoker  . Smokeless tobacco: Never Used  Substance and Sexual Activity  . Alcohol use: Never    Frequency: Never  . Drug use: Never  . Sexual activity: Never  Lifestyle  . Physical activity    Days per week: Not on file  Minutes per session: Not on file  . Stress: Not on file  Relationships  . Social Musicianconnections    Talks on phone: Not on file    Gets together: Not on file    Attends religious service: Not on file    Active member of club or organization: Not on file    Attends meetings of clubs or organizations: Not on file    Relationship status: Not on file  Other Topics Concern  . Not on file  Social History Narrative  . Not on file    Allergies:  Allergies  Allergen Reactions  . Clindamycin/Lincomycin     Metabolic Disorder Labs: No results found for: HGBA1C, MPG No results found for: PROLACTIN No results found for: CHOL, TRIG, HDL, CHOLHDL, VLDL, LDLCALC Lab Results  Component Value Date   TSH 1.660 01/21/2017    Therapeutic Level Labs: No results found for: LITHIUM No results found for: VALPROATE No components found for:  CBMZ  Current Medications: Current Outpatient Medications  Medication Sig Dispense Refill  . atomoxetine (STRATTERA) 80 MG capsule Take 1 capsule (80 mg total) by mouth daily. 30 capsule 2  . cefPROZIL (CEFZIL) 500 MG tablet Take 1 tablet (500 mg total) by mouth 2 (two) times daily. 20 tablet 0  . guanFACINE (INTUNIV) 1 MG TB24 ER  tablet Take 1 tablet (1 mg total) by mouth 3 (three) times daily. 90 tablet 2  . neomycin-polymyxin-hydrocortisone (CORTISPORIN) OTIC solution 3 -4 drops to right ear qid 10 mL 0   No current facility-administered medications for this visit.      Musculoskeletal: Strength & Muscle Tone: within normal limits Gait & Station: normal Patient leans: N/A  Psychiatric Specialty Exam: Review of Systems  All other systems reviewed and are negative.   There were no vitals taken for this visit.There is no height or weight on file to calculate BMI.  General Appearance: NA  Eye Contact:  NA  Speech:  Clear and Coherent  Volume:  Normal  Mood:  Euthymic  Affect:  NA  Thought Process:  Goal Directed  Orientation:  Full (Time, Place, and Person)  Thought Content: WDL   Suicidal Thoughts:  No  Homicidal Thoughts:  No  Memory:  Immediate;   Good Recent;   Good Remote;   Fair  Judgement:  Fair  Insight:  Fair  Psychomotor Activity:  Normal  Concentration:  Concentration: Good and Attention Span: Good  Recall:  Good  Fund of Knowledge: Good  Language: Good  Akathisia:  No  Handed:  Right  AIMS (if indicated): not done  Assets:  Communication Skills Desire for Improvement Physical Health Resilience Social Support Talents/Skills  ADL's:  Intact  Cognition: WNL  Sleep:  Good   Screenings: PHQ2-9     [facility] Visit [facility]/06/2017 in [facility]  PHQ-2 Total Score  0       Assessment and Plan: This patient is a 23 year old female with a history of ADD and high functioning autistic disorder.  She is doing well despite the recent stressors.  She will continue Strattera 80 mg daily as well as Intuniv 1 mg 3 times daily both for ADD.  She will return to see me in 3 months   Diannia Rudereborah Ross, MD 11/02/2018, 10:22 AM

## 2019-02-02 ENCOUNTER — Other Ambulatory Visit: Payer: Self-pay

## 2019-02-02 ENCOUNTER — Encounter (HOSPITAL_COMMUNITY): Payer: Self-pay | Admitting: Psychiatry

## 2019-02-02 ENCOUNTER — Ambulatory Visit (INDEPENDENT_AMBULATORY_CARE_PROVIDER_SITE_OTHER): Payer: Medicaid Other | Admitting: Psychiatry

## 2019-02-02 DIAGNOSIS — F840 Autistic disorder: Secondary | ICD-10-CM

## 2019-02-02 DIAGNOSIS — F900 Attention-deficit hyperactivity disorder, predominantly inattentive type: Secondary | ICD-10-CM

## 2019-02-02 MED ORDER — GUANFACINE HCL ER 1 MG PO TB24: 1.0000 mg | ORAL_TABLET | Freq: Three times a day (TID) | ORAL | 2 refills | Status: DC

## 2019-02-02 MED ORDER — ATOMOXETINE HCL 80 MG PO CAPS: 80.0000 mg | ORAL_CAPSULE | Freq: Every day | ORAL | 2 refills | Status: DC

## 2019-02-02 NOTE — Progress Notes (Signed)
Virtual Visit via Telephone Note  I connected with Rebekah Haynes on 02/02/19 at 10:20 AM EDT by telephone and verified that I am speaking with the correct person using two identifiers.   I discussed the limitations, risks, security and privacy concerns of performing an evaluation and management service by telephone and the availability of in person appointments. I also discussed with the patient that there may be a patient responsible charge related to this service. The patient expressed understanding and agreed to proceed.    I discussed the assessment and treatment plan with the patient. The patient was provided an opportunity to ask questions and all were answered. The patient agreed with the plan and demonstrated an understanding of the instructions.   The patient was advised to call back or seek an in-person evaluation if the symptoms worsen or if the condition fails to improve as anticipated.  I provided 15 minutes of non-face-to-face time during this encounter.   Levonne Spiller, MD  [facility] MD/PA/NP OP Progress Note  02/02/2019 10:13 AM Rebekah Haynes  MRN:  [id number]  Chief Complaint:  Chief Complaint    ADD; Follow-up     HPI: This patient is a 23 year old single white female who lives with her mother stepfather and 45 year old sister in [place]. She only sees her father very rarely. She works part-time in the [employer]. She is on disability for autistic spectrum disorder and ADHD. Her mother is currently her payee.  The patient and mother are self-referred. The patient went to the psychiatric and counseling center in [place] for many years and saw Dr. Ronnald Ramp. She was originally diagnosed with ADHD as a very young child and later in high school with autistic spectrum disorder. In the past she had been on medications to help with focus and impulsivity such as Strattera and Intuniv. She had a lapse in her Medicaid and is now back on it after 3 years and  would like to return to using the medications.  The patient presents today with her mother. The mother states that her pregnancy with this patient was normal and she did not use drugs alcohol or medications. The patient was born full-term without difficulty she was a difficult colicky baby who cried a lot and was hard to soothe. Her milestones in terms of walking and gross motor skills were normal but her speech was quite garbled and she really did not put words together until approximately age 63. She had speech therapy in elementary school as well as occupational therapy because of poor fine motor skills. According to mother she did not have cognitive delays. She was always a child to stay to her self and and did not interact much with other people. She is always had difficulty with transitions and did not like crowds or noises and does best when things are predictable and the same  The patient was tried on stimulants when she was young because she was very hyperactive over talkative and disruptive in class. The stimulants made her "too shut down" and she was switched to Strattera which she stayed out all the way through high school. She was also on intunivwhich seemed to help her impulsivity. She has been off these medications for 3 years due to insurance lapse. 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  Her goal is to eventually build computers but she does not seem to enthusiastic about going back into college full-time. She spends most of her time playing video games watching [employer]. All of her friends are online. She does not use drugs alcohol cigarettes and is not sexually active. She denies depression or anxiety currently.  The mother states that she and her husband would like to see the patient become  more independent. The patient has worked with vocational rehab and they have done some testing and they helped her get her job. The patient has a driver's license but is too anxious and scared to drive. Her mother does all her money but she tends to spend too much on food. She has gained 14 pounds in the last year. Cognitively it seems that she could manage things on her own but she tends to make impulsive decisions. For now I suggested that we get her back on medication and see what her level of function is and also review her testing.  The patient returns for follow-up via telephone after 3 months.  She states that everything is going fairly well.  She continues to work in [employer] and they have gotten some additional help so she has less of a workload.  She continues to take a computer course online.  Her interests have not changed and she is still plays video games with her friends online.  She denies being depressed.  Her sleep is okay but not great but it has not changed.  She denies any difficulties getting along with people at home or work and denies any thoughts of self-harm.  She still feels that her medications have helped her with focus. Visit Diagnosis:    ICD-10-CM   1. Attention deficit hyperactivity disorder (ADHD), predominantly inattentive type  F90.0   2. Autistic spectrum disorder  F84.0     Past Psychiatric History: The patient was treated by Dr. Yetta BarreJones in [place] for approximately 7 years with a diagnosis of ADHD and autism.  For most of this time she was on Strattera 80 mg daily and Intuniv 3 mg daily  Past Medical History:  Past Medical History:  Diagnosis Date  . ADHD (attention deficit hyperactivity disorder)   . Autistic spectrum disorder   . MRSA (methicillin resistant Staphylococcus aureus)   . Reflux     Past Surgical History:  Procedure Laterality Date  . MASTOIDECTOMY      Family Psychiatric History: see below  Family History:  Family History   Problem Relation Age of Onset  . Alcohol abuse Father   . Drug abuse Father     Social History:  Social History   Socioeconomic History  . Marital status: Single    Spouse name: Not on file  . Number of children: Not on file  . Years of education: Not on file  . Highest education level: Not on file  Occupational History  . Not on file  Social Needs  . Financial resource strain: Not on file  . Food insecurity    Worry: Not on file    Inability: Not on file  . Transportation needs    Medical: Not on file    Non-medical: Not on file  Tobacco Use  . Smoking status: Never Smoker  . Smokeless tobacco: Never Used  Substance and Sexual Activity  . Alcohol use: Never    Frequency: Never  . Drug use: Never  . Sexual activity: Never  Lifestyle  . Physical activity  Days per week: Not on file    Minutes per session: Not on file  . Stress: Not on file  Relationships  . Social Musician on phone: Not on file    Gets together: Not on file    Attends religious service: Not on file    Active member of club or organization: Not on file    Attends meetings of clubs or organizations: Not on file    Relationship status: Not on file  Other Topics Concern  . Not on file  Social History Narrative  . Not on file    Allergies:  Allergies  Allergen Reactions  . Clindamycin/Lincomycin     Metabolic Disorder Labs: No results found for: HGBA1C, MPG No results found for: PROLACTIN No results found for: CHOL, TRIG, HDL, CHOLHDL, VLDL, LDLCALC Lab Results  Component Value Date   TSH 1.660 01/21/2017    Therapeutic Level Labs: No results found for: LITHIUM No results found for: VALPROATE No components found for:  CBMZ  Current Medications: Current Outpatient Medications  Medication Sig Dispense Refill  . atomoxetine (STRATTERA) 80 MG capsule Take 1 capsule (80 mg total) by mouth daily. 30 capsule 2  . guanFACINE (INTUNIV) 1 MG TB24 ER tablet Take 1 tablet (1 mg  total) by mouth 3 (three) times daily. 90 tablet 2  . neomycin-polymyxin-hydrocortisone (CORTISPORIN) OTIC solution 3 -4 drops to right ear qid 10 mL 0   No current facility-administered medications for this visit.      Musculoskeletal: Strength & Muscle Tone: within normal limits Gait & Station: normal Patient leans: N/A  Psychiatric Specialty Exam: Review of Systems  All other systems reviewed and are negative.   There were no vitals taken for this visit.There is no height or weight on file to calculate BMI.  General Appearance: NA  Eye Contact:  NA  Speech:  Clear and Coherent  Volume:  Normal  Mood:  Euthymic  Affect:  NA  Thought Process:  Goal Directed  Orientation:  Full (Time, Place, and Person)  Thought Content: WDL   Suicidal Thoughts:  No  Homicidal Thoughts:  No  Memory:  Immediate;   Good Recent;   Good Remote;   Good  Judgement:  Fair  Insight:  Shallow  Psychomotor Activity:  Normal  Concentration:  Concentration: Good and Attention Span: Good  Recall:  Good  Fund of Knowledge: Good  Language: Good  Akathisia:  No  Handed:  Right  AIMS (if indicated): not done  Assets:  Communication Skills Desire for Improvement Physical Health Resilience Social Support Talents/Skills  ADL's:  Intact  Cognition: WNL  Sleep:  Fair   Screenings: PHQ2-9     Office Visit from 03/06/2017 in [facility]  PHQ-2 Total Score  0       Assessment and Plan: This patient is a 23 year old female with a history of ADD and high functioning autistic disorder.  She is doing well by her description.  She will continue Strattera 80 mg daily as well as Intuniv 1 mg 3 times daily both for ADD.  She will return to see me in 3 months   Diannia Ruder, MD 02/02/2019, 10:13 AM

## 2019-03-12 ENCOUNTER — Other Ambulatory Visit: Payer: Self-pay

## 2019-03-12 DIAGNOSIS — Z20822 Contact with and (suspected) exposure to covid-19: Secondary | ICD-10-CM

## 2019-03-14 LAB — NOVEL CORONAVIRUS, NAA: SARS-CoV-2, NAA: NOT DETECTED

## 2019-03-15 ENCOUNTER — Telehealth: Payer: Self-pay | Admitting: *Deleted

## 2019-03-15 NOTE — Telephone Encounter (Signed)
Patient notified of negative COVID result.

## 2019-04-19 ENCOUNTER — Ambulatory Visit: Payer: Medicaid Other | Attending: Internal Medicine

## 2019-04-19 ENCOUNTER — Other Ambulatory Visit: Payer: Self-pay

## 2019-04-19 DIAGNOSIS — Z20822 Contact with and (suspected) exposure to covid-19: Secondary | ICD-10-CM

## 2019-04-20 ENCOUNTER — Telehealth: Payer: Self-pay | Admitting: *Deleted

## 2019-04-20 LAB — NOVEL CORONAVIRUS, NAA: SARS-CoV-2, NAA: DETECTED — AB

## 2019-04-20 NOTE — Telephone Encounter (Signed)
Mother, Leticia Penna calle in requesting daughter's COVID-19 test result.   I let her know it wasn't processed yet.

## 2019-04-23 ENCOUNTER — Telehealth: Payer: Self-pay | Admitting: *Deleted

## 2019-04-23 NOTE — Telephone Encounter (Signed)
Glo Herring called in requesting help for Rebekah Haynes in setting up her MyChart account. I entered the new e mail address and sent her a link so she could set up her account.   I gave Synetta Fail the MyChart help desk number in case they had a problem. She thanked me for my help.

## 2019-07-14 ENCOUNTER — Encounter: Payer: Self-pay | Admitting: Emergency Medicine

## 2019-07-14 ENCOUNTER — Other Ambulatory Visit: Payer: Self-pay

## 2019-07-14 ENCOUNTER — Ambulatory Visit
Admission: EM | Admit: 2019-07-14 | Discharge: 2019-07-14 | Disposition: A | Discharge status: Home / Self Care | Payer: Medicaid Other | Attending: Emergency Medicine | Admitting: Emergency Medicine

## 2019-07-14 DIAGNOSIS — M549 Dorsalgia, unspecified: Secondary | ICD-10-CM | POA: Diagnosis not present

## 2019-07-14 MED ORDER — NAPROXEN 500 MG PO TABS: 500.0000 mg | ORAL_TABLET | Freq: Two times a day (BID) | ORAL | 0 refills | Status: DC

## 2019-07-14 MED ORDER — CYCLOBENZAPRINE HCL 10 MG PO TABS: 10.0000 mg | ORAL_TABLET | Freq: Every day | ORAL | 0 refills | Status: DC

## 2019-07-14 NOTE — ED Provider Notes (Signed)
[facility]   [id number] 07/14/19 Arrival Time: 1442  CC: Back and shoulder pain  SUBJECTIVE: History from: patient. Rebekah Haynes is a 24 y.o. female complains of LT upper back and shoulder pain x 4 days ago.  Denies a precipitating event or specific injury, but does admit to lifting boxes at work.  Localizes the pain to the LT mid back.  Describes the pain as intermittent and "sandpaper over a fire" in character.  Has tried OTC tylenol without relief.  Denies aggravating factors with ROM.  Complains of mild numbness/ tingling.  Denies fever, chills, erythema, ecchymosis, effusion, weakness.  ROS: As per HPI.  All other pertinent ROS negative.     Past Medical History:  Diagnosis Date  . ADHD (attention deficit hyperactivity disorder)   . Autistic spectrum disorder   . MRSA (methicillin resistant Staphylococcus aureus)   . Reflux    Past Surgical History:  Procedure Laterality Date  . MASTOIDECTOMY     Allergies  Allergen Reactions  . Clindamycin/Lincomycin    No current facility-administered medications on file prior to encounter.   Current Outpatient Medications on File Prior to Encounter  Medication Sig Dispense Refill  . atomoxetine (STRATTERA) 80 MG capsule Take 1 capsule (80 mg total) by mouth daily. 30 capsule 2  . guanFACINE (INTUNIV) 1 MG TB24 ER tablet Take 1 tablet (1 mg total) by mouth 3 (three) times daily. 90 tablet 2   Social History   Socioeconomic History  . Marital status: Single    Spouse name: Not on file  . Number of children: Not on file  . Years of education: Not on file  . Highest education level: Not on file  Occupational History  . Not on file  Tobacco Use  . Smoking status: Never Smoker  . Smokeless tobacco: Never Used  Substance and Sexual Activity  . Alcohol use: Never  . Drug use: Never  . Sexual activity: Never  Other Topics Concern  . Not on file  Social History Narrative  . Not on file   Social Determinants of  Health   Financial Resource Strain:   . Difficulty of Paying Living Expenses:   Food Insecurity:   . Worried About Programme researcher, broadcasting/film/video in the Last Year:   . Barista in the Last Year:   Transportation Needs:   . Freight forwarder (Medical):   Marland Kitchen Lack of Transportation (Non-Medical):   Physical Activity:   . Days of Exercise per Week:   . Minutes of Exercise per Session:   Stress:   . Feeling of Stress :   Social Connections:   . Frequency of Communication with Friends and Family:   . Frequency of Social Gatherings with Friends and Family:   . Attends Religious Services:   . Active Member of Clubs or Organizations:   . Attends Banker Meetings:   Marland Kitchen Marital Status:   Intimate Partner Violence:   . Fear of Current or Ex-Partner:   . Emotionally Abused:   Marland Kitchen Physically Abused:   . Sexually Abused:    Family History  Problem Relation Age of Onset  . Alcohol abuse Father   . Drug abuse Father     OBJECTIVE:  Vitals:   07/14/19 1453  BP: 119/80  Pulse: 74  Resp: 16  Temp: 98 F (36.7 C)  TempSrc: Oral  SpO2: 98%    General appearance: ALERT; in no acute distress.  Head: NCAT Lungs: Normal respiratory effort;  CTAB CV: RRR Musculoskeletal: Back Inspection: Skin warm, dry, clear and intact without obvious erythema, effusion, or ecchymosis.  Palpation: TTP over LT paravertebral musculature of the mid back ROM: FROM active and passive Strength: 5/5 shld abduction, 5/5 shld adduction, 5/5 elbow flexion, 5/5 elbow extension, 5/5 grip strength Skin: warm and dry Neurologic: Ambulates without difficulty; Sensation intact about the upper extremities Psychological: alert and cooperative; normal mood and affect  ASSESSMENT & PLAN:  1. Upper back pain on left side     Meds ordered this encounter  Medications  . naproxen (NAPROSYN) 500 MG tablet    Sig: Take 1 tablet (500 mg total) by mouth 2 (two) times daily.    Dispense:  30 tablet    Refill:   0    Order Specific Question:   Supervising Provider    Answer:   Raylene Everts [[id number]]  . cyclobenzaprine (FLEXERIL) 10 MG tablet    Sig: Take 1 tablet (10 mg total) by mouth at bedtime.    Dispense:  15 tablet    Refill:  0    Order Specific Question:   Supervising Provider    Answer:   Raylene Everts [[id number]]    Continue conservative management of rest, ice, and gentle stretches Take naproxen as needed for pain relief (may cause abdominal discomfort, ulcers, and GI bleeds avoid taking with other NSAIDs) Take cyclobenzaprine at nighttime for symptomatic relief. Avoid driving or operating heavy machinery while using medication. Follow up with PCP if symptoms persist Return or go to the ER if you have any new or worsening symptoms (fever, chills, chest pain, shortness of breath, numbness/ tingling, worsening symptoms despite treatment, etc...)   Reviewed expectations re: course of current medical issues. Questions answered. Outlined signs and symptoms indicating need for more acute intervention. Patient verbalized understanding. After Visit Summary given.    Lestine Box, PA-C 07/14/19 1529

## 2019-07-14 NOTE — Discharge Instructions (Signed)
Continue conservative management of rest, ice, and gentle stretches Take naproxen as needed for pain relief (may cause abdominal discomfort, ulcers, and GI bleeds avoid taking with other NSAIDs) Take cyclobenzaprine at nighttime for symptomatic relief. Avoid driving or operating heavy machinery while using medication. Follow up with PCP if symptoms persist Return or go to the ER if you have any new or worsening symptoms (fever, chills, chest pain, shortness of breath, numbness/ tingling, worsening symptoms despite treatment, etc...)

## 2019-07-14 NOTE — ED Triage Notes (Signed)
Upper back pain since Saturday.  Left shoulder is sore, right shoulder not as sore.  No known injury

## 2019-07-20 ENCOUNTER — Other Ambulatory Visit: Payer: Self-pay

## 2019-07-20 ENCOUNTER — Ambulatory Visit (INDEPENDENT_AMBULATORY_CARE_PROVIDER_SITE_OTHER): Payer: Medicaid Other | Admitting: Family Medicine

## 2019-07-20 ENCOUNTER — Encounter: Payer: Self-pay | Admitting: Family Medicine

## 2019-07-20 VITALS — BP 112/72 | HR 88 | Temp 98.2°F | Ht 61.5 in | Wt 165.2 lb

## 2019-07-20 DIAGNOSIS — M25512 Pain in left shoulder: Secondary | ICD-10-CM

## 2019-07-20 DIAGNOSIS — M898X1 Other specified disorders of bone, shoulder: Secondary | ICD-10-CM | POA: Diagnosis not present

## 2019-07-20 DIAGNOSIS — S46812D Strain of other muscles, fascia and tendons at shoulder and upper arm level, left arm, subsequent encounter: Secondary | ICD-10-CM

## 2019-07-20 NOTE — Patient Instructions (Addendum)
Take 500mg  tylenol every 6 hrs.  Continue to take naproxen 500mg  2x per day.  Heat to the back 2-3 x per day for .  And follow up with physical therapy. If not improving call or return to office.     Muscle Strain A muscle strain is an injury that happens when a muscle is stretched longer than normal. This can happen during a fall, sports, or lifting. This can tear some muscle fibers. Usually, recovery from muscle strain takes 1-2 weeks. Complete healing normally takes 5-6 weeks. This condition is first treated with PRICE therapy. This involves:  Protecting your muscle from being injured again.  Resting your injured muscle.  Icing your injured muscle.  Applying pressure (compression) to your injured muscle. This may be done with a splint or elastic bandage.  Raising (elevating) your injured muscle. Your doctor may also recommend medicine for pain. Follow these instructions at home: If you have a splint:  Wear the splint as told by your doctor. Take it off only as told by your doctor.  Loosen the splint if your fingers or toes tingle, get numb, or turn cold and blue.  Keep the splint clean.  If the splint is not waterproof: ? Do not let it get wet. ? Cover it with a watertight covering when you take a bath or a shower. Managing pain, stiffness, and swelling   If directed, put ice on your injured area. ? If you have a removable splint, take it off as told by your doctor. ? Put ice in a plastic bag. ? Place a towel between your skin and the bag. ? Leave the ice on for 20 minutes, 2-3 times a day.  Move your fingers or toes often. This helps to avoid stiffness and lessen swelling.  Raise your injured area above the level of your heart while you are sitting or lying down.  Wear an elastic bandage as told by your doctor. Make sure it is not too tight. General instructions  Take over-the-counter and prescription medicines only as told by your doctor.  Limit your  activity. Rest your injured muscle as told by your doctor. Your doctor may say that gentle movements are okay.  If physical therapy was prescribed, do exercises as told by your doctor.  Do not put pressure on any part of the splint until it is fully hardened. This may take many hours.  Do not use any products that contain nicotine or tobacco, such as cigarettes and e-cigarettes. These can delay bone healing. If you need help quitting, ask your doctor.  Warm up before you exercise. This helps to prevent more muscle strains.  Ask your doctor when it is safe to drive if you have a splint.  Keep all follow-up visits as told by your doctor. This is important. Contact a doctor if:  You have more pain or swelling in your injured area. Get help right away if:  You have any of these problems in your injured area: ? You have numbness. ? You have tingling. ? You lose a lot of strength. Summary  A muscle strain is an injury that happens when a muscle is stretched longer than normal.  This condition is first treated with PRICE therapy. This includes protecting, resting, icing, adding pressure, and raising your injury.  Limit your activity. Rest your injured muscle as told by your doctor. Your doctor may say that gentle movements are okay.  Warm up before you exercise. This helps to prevent more muscle strains.  This information is not intended to replace advice given to you by your health care provider. Make sure you discuss any questions you have with your health care provider. Document Revised: 05/21/2018 Document Reviewed: 05/01/2016 Elsevier Patient Education  [place].

## 2019-07-20 NOTE — Progress Notes (Addendum)
   Subjective:    Patient ID: Rebekah Haynes, female    DOB: 10-08-1995, 24 y.o.   MRN: [id number]  HPI  Patient arrives for a follow up on recent urgent care visit for upper back pain and left shoulder pain. Patient was given Naprosyn and flexeril and is feeling some better but still having some discomfort.  Has some lifting at work, but nothing out of ordinary.  No trauma or injury to the shoulder/back.  Does get some relief from naprosyn.  Has been using ice occasionally. Denies hand or arm numbness/tingling.  Has not had this pain before in back/shoulder.    Review of Systems  Constitutional: Negative for chills and fever.  HENT: Negative for congestion, rhinorrhea and sore throat.   Respiratory: Negative for cough, shortness of breath and wheezing.   Cardiovascular: Negative for chest pain and leg swelling.  Gastrointestinal: Negative for abdominal pain, diarrhea, nausea and vomiting.  Genitourinary: Negative for dysuria and frequency.  Musculoskeletal: Positive for back pain (left upper back, scapular area pain). Negative for arthralgias, neck pain and neck stiffness.  Skin: Negative for rash.  Neurological: Negative for dizziness, weakness, numbness and headaches.   Vitals:   07/20/19 1342  BP: 112/72  Pulse: 88  Temp: 98.2 F (36.8 C)  SpO2: 98%       Objective:   Physical Exam Vitals reviewed.  Pulmonary:     Effort: Pulmonary effort is normal. No respiratory distress.  Musculoskeletal:        General: Tenderness (ttp over left trapezius and left rhombus.) present. No deformity or signs of injury. Normal range of motion.     Cervical back: Normal range of motion. No tenderness.     Comments: MS 5/5 bilateral UEs.  Negative for spinous process tenderness in T and L spine.  Skin:    General: Skin is warm and dry.     Findings: No rash.  Neurological:     General: No focal deficit present.     Mental Status: She is alert and oriented to person, place, and  time.     Cranial Nerves: No cranial nerve deficit.     Sensory: No sensory deficit.     Motor: No weakness.     Vitals:   07/20/19 1342  BP: 112/72  Pulse: 88  Temp: 98.2 F (36.8 C)  SpO2: 98%       Assessment & Plan:   1. Trapezius strain, left, subsequent encounter - Ambulatory referral to Physical Therapy  2. Pain of left scapula - Ambulatory referral to Physical Therapy  Cont to take naprosyn 500mg  bid.  Add tylenol 500mg  q 6hrs.  Add Heat 2-3 x per day for .  Follow up with PT.  F/u 3-4 wks if not improving.  Pt in agreement with plan.

## 2019-07-22 ENCOUNTER — Encounter: Payer: Self-pay | Admitting: Family Medicine

## 2019-08-23 ENCOUNTER — Other Ambulatory Visit: Payer: Self-pay

## 2019-08-23 ENCOUNTER — Ambulatory Visit (HOSPITAL_COMMUNITY): Payer: Medicaid Other | Attending: Family Medicine | Admitting: Occupational Therapy

## 2019-08-23 ENCOUNTER — Encounter (HOSPITAL_COMMUNITY): Payer: Self-pay | Admitting: Occupational Therapy

## 2019-08-23 DIAGNOSIS — R29898 Other symptoms and signs involving the musculoskeletal system: Secondary | ICD-10-CM | POA: Insufficient documentation

## 2019-08-23 DIAGNOSIS — M25512 Pain in left shoulder: Secondary | ICD-10-CM | POA: Diagnosis present

## 2019-08-23 NOTE — Therapy (Addendum)
[facility] [address] [place], [place], [address] Phone: [phone]   Fax:  [phone]  Occupational Therapy Evaluation  Patient Details  Name: Rebekah Haynes MRN: [id number] Date of Birth: 04-04-1996 Referring Provider (OT): Dr. Laroy Apple   Encounter Date: 08/23/2019  OT End of Session - 08/23/19 1147    Visit Number  1    Number of Visits  5    Date for OT Re-Evaluation  09/22/19    Authorization Type  Medicaid    Authorization Time Period  Requesting initial 3 visits    OT Start Time  1110    OT Stop Time  1142    OT Time Calculation (min)  32 min    Activity Tolerance  Patient tolerated treatment well    Behavior During Therapy  [facility] for tasks assessed/performed       Past Medical History:  Diagnosis Date  . ADHD (attention deficit hyperactivity disorder)   . Autistic spectrum disorder   . MRSA (methicillin resistant Staphylococcus aureus)   . Reflux     Past Surgical History:  Procedure Laterality Date  . MASTOIDECTOMY      There were no vitals filed for this visit.  Subjective Assessment - 08/23/19 1145    Subjective   S: It's gotten a little better since it started but not much.    Pertinent History  Pt is a 24 y/o female presenting with left shoulder pain primarily around scapula that began 07/14/19 with no known injury or cause. Pt reports minimal changes since onset, did not improve with heat or ice. MD dx pt with left trapezius strain. Pt was referred to occupational therapy for evaluation and treatment by Dr. Laroy Apple.    Patient Stated Goals  To have less pain in my shoulder.    Currently in Pain?  Yes    Pain Score  1     Pain Location  Shoulder    Pain Orientation  Left    Pain Descriptors / Indicators  Aching    Pain Type  Acute pain    Pain Radiating Towards  shoulder blade    Pain Onset  More than a month ago    Pain Frequency  Constant    Aggravating Factors   movement, use    Pain  Relieving Factors  rest    Effect of Pain on Daily Activities  mod effect on work tasks by end of shift.    Multiple Pain Sites  No        [facility] OT Assessment - 08/23/19 1105      Assessment   Medical Diagnosis  left trapezius strain    Referring Provider (OT)  Dr. Laroy Apple    Onset Date/Surgical Date  07/14/19    Hand Dominance  Right    Next MD Visit  None scheduled     Prior Therapy  None      Precautions   Precautions  None      Restrictions   Weight Bearing Restrictions  No      Balance Screen   Has the patient fallen in the past 6 months  No    Has the patient had a decrease in activity level because of a fear of falling?   No    Is the patient reluctant to leave their home because of a fear of falling?   No      Prior Function   Level of Independence  Independent with  basic ADLs    Vocation  Part time employment    Vocation Requirements  [employer]. Requires lifting and reaching    Leisure  working with [employer], technology related hobbies      ADL   ADL comments  Pt is having difficulty with work tasks, putting a bra on, reaching overhead and behind back, any movement increases the pain slightly. Sleep is difficult at times due to discomfort.      Written Expression   Dominant Hand  Right      Cognition   Overall Cognitive Status  Within Functional Limits for tasks assessed      ROM / Strength   AROM / PROM / Strength  AROM;Strength      Palpation   Palpation comment  mod fascial restrictions in left anterior shoulder, trapezius, and scapular regions      AROM   Overall AROM Comments  A/ROM assessed seated, er/IR adducted. A/ROM is equivalent to RUE    AROM Assessment Site  Shoulder    Right/Left Shoulder  Left    Left Shoulder Flexion  155 Degrees    Left Shoulder ABduction  153 Degrees    Left Shoulder Internal Rotation  90 Degrees    Left Shoulder External Rotation  80 Degrees      Strength   Overall Strength Comments  Assessed  seated, er/IR adducted. Trapezius strength is 4-/5 throughout    Strength Assessment Site  Shoulder    Right/Left Shoulder  Left    Left Shoulder Flexion  5/5    Left Shoulder ABduction  5/5    Left Shoulder Internal Rotation  5/5    Left Shoulder External Rotation  5/5                      OT Education - 08/23/19 1145    Education Details  shoulder stretches, red scapular theraband    Person(s) Educated  Patient    Methods  Explanation;Demonstration;Handout    Comprehension  Verbalized understanding;Returned demonstration       OT Short Term Goals - 08/23/19 1151      OT SHORT TERM GOAL #1   Title  Pt will be provided with and educated on HEP to improve functional use of LUE as non-dominant during ADLs.    Time  4    Period  Weeks    Status  New    Target Date  09/22/19      OT SHORT TERM GOAL #2   Title  Pt will decrease fascial restrictions in LUE to minimal amounts or less to improve mobility required for functional reaching tasks.    Time  4    Period  Weeks    Status  New      OT SHORT TERM GOAL #3   Title  Pt will increase LUE trapezius and scapular strength to 4+/5 or greater to improve ability to perform lifting tasks at work.    Time  4    Period  Weeks    Status  New      OT SHORT TERM GOAL #4   Title  Pt will decrease pain in LUE to 2/10 or less to improve ability to sleep on left side for 4 consecutive hours or greater without discomfort or need to reposition.    Time  4    Period  Weeks    Status  New  Plan - 08/23/19 1148    Clinical Impression Statement  A: Pt is a 24 y/o female presenting with left trapezius strain onset approximately 07/14/19. Pt demonstrate ROM WFL and RC strength is WNL, trapezius strength is decreased and pain is localized mostly to trapezius and scapular regions. Pt is experiencing difficulty with functional use of LUE during ADLs and work tasks.    OT Occupational Profile and History  Problem  Focused Assessment - Including review of records relating to presenting problem    Occupational performance deficits (Please refer to evaluation for details):  ADL's;IADL's;Rest and Sleep;Work    Marketing executive / Function / Physical Skills  ADL;Endurance;UE functional use;Fascial restriction;Pain;ROM;IADL;Strength    Rehab Potential  Good    Clinical Decision Making  Limited treatment options, no task modification necessary    Comorbidities Affecting Occupational Performance:  None    Modification or Assistance to Complete Evaluation   No modification of tasks or assist necessary to complete eval    OT Frequency  1x / week    OT Duration  4 weeks    OT Treatment/Interventions  Self-care/ADL training;Ultrasound;Patient/family education;Passive range of motion;Cryotherapy;Electrical Stimulation;Moist Heat;Therapeutic exercise;Manual Therapy;Therapeutic activities    Plan  P: Pt will benefit from skilled OT services to decrease pain and fascial restrictions, increase strength and functional use of LUE. Treatment plan: myofascial release, manual techniques, passive stretching, A/ROM, trapezius strengthening, scapular strengthening and stability, modalities prn    OT Home Exercise Plan  eval: shoulder stretches, red scapular theraband    Consulted and Agree with Plan of Care  Patient       Patient will benefit from skilled therapeutic intervention in order to improve the following deficits and impairments:   Body Structure / Function / Physical Skills: ADL, Endurance, UE functional use, Fascial restriction, Pain, ROM, IADL, Strength       Visit Diagnosis: Acute pain of left shoulder  Other symptoms and signs involving the musculoskeletal system    Problem List Patient Active Problem List   Diagnosis Date Noted  . Attention deficit hyperactivity disorder (ADHD) 04/30/2018  . Autistic spectrum disorder 04/30/2018   Guadelupe Sabin, OTR/L  [phone] 08/23/2019, 11:53 AM  [place], [place], [address] Phone: [phone]   Fax:  [phone]  Name: Rebekah Haynes MRN: [id number] Date of Birth: [birth year]

## 2019-08-23 NOTE — Patient Instructions (Signed)
1) (Home) Extension: Isometric / Bilateral Arm Retraction - Sitting   Facing anchor, hold hands and elbow at shoulder height, with elbow bent.  Pull arms back to squeeze shoulder blades together. Repeat 10-15 times. 1-3 times/day.   2) (Clinic) Extension / Flexion (Assist)   Face anchor, pull arms back, keeping elbow straight, and squeze shoulder blades together. Repeat 10-15 times. 1-3 times/day.   Copyright  VHI. All rights reserved.   3) (Home) Retraction: Row - Bilateral (Anchor)   Facing anchor, arms reaching forward, pull hands toward stomach, keeping elbows bent and at your sides and pinching shoulder blades together. Repeat 10-15 times. 1-3 times/day.   Copyright  VHI. All rights reserved.    Stretches  1) Flexion Wall Stretch    Face wall, place affected handon wall in front of you. Slide hand up the wall  and lean body in towards the wall. Hold for 10 seconds. Repeat 3-5 times. 1-2 times/day.      2) Corner Stretch    Stand at a corner of a wall, place your arms on the walls with elbows bent. Lean into the corner until a stretch is felt along the front of your chest and/or shoulders. Hold for 10 seconds. Repeat 3-5X, 1-2 times/day.    3) Shoulder abduction stretch    Stand beside wall, place affected hand on wall beside you. Slide hand up the wall  and lean body in towards the wall. Hold for 10-20 seconds. Repeat 3-5 times. 1-2 times/day.

## 2019-08-30 ENCOUNTER — Telehealth (HOSPITAL_COMMUNITY): Payer: Self-pay | Admitting: Occupational Therapy

## 2019-08-30 NOTE — Telephone Encounter (Signed)
pt cancelled appt on 5/25 because she will be at work

## 2019-08-31 ENCOUNTER — Encounter (HOSPITAL_COMMUNITY): Payer: Medicaid Other | Admitting: Occupational Therapy

## 2019-09-06 ENCOUNTER — Ambulatory Visit
Admission: EM | Admit: 2019-09-06 | Discharge: 2019-09-06 | Disposition: A | Discharge status: Home / Self Care | Payer: Medicaid Other | Attending: Emergency Medicine | Admitting: Emergency Medicine

## 2019-09-06 ENCOUNTER — Other Ambulatory Visit: Payer: Self-pay

## 2019-09-06 DIAGNOSIS — H60391 Other infective otitis externa, right ear: Secondary | ICD-10-CM

## 2019-09-06 DIAGNOSIS — H65192 Other acute nonsuppurative otitis media, left ear: Secondary | ICD-10-CM

## 2019-09-06 MED ORDER — CIPROFLOXACIN-DEXAMETHASONE 0.3-0.1 % OT SUSP: 4.0000 [drp] | Freq: Two times a day (BID) | OTIC | 0 refills | Status: DC

## 2019-09-06 MED ORDER — FLUTICASONE PROPIONATE 50 MCG/ACT NA SUSP: 1.0000 | Freq: Every day | NASAL | 0 refills | Status: DC

## 2019-09-06 NOTE — ED Triage Notes (Signed)
Pt presents with c/o right ear pain that began yesterday

## 2019-09-06 NOTE — ED Provider Notes (Addendum)
[facility]   [id number] 09/06/19 Arrival Time: 2774  CC:EAR PAIN  SUBJECTIVE: History from: patient and caregiver.  Rebekah Haynes is a 24 y.o. female who presents with of rightear pain for the past 1-2 days.  Denies a precipitating event, such as swimming or wearing ear plugs.  Patient states the pain is constant and achy in character.  Patient has not tried any OTC medication.  Symptoms are made worse with lying down.  Denies similar symptoms in the past.  Denies fever, chills, fatigue, sinus pain, rhinorrhea, ear discharge, sore throat, SOB, wheezing, chest pain, nausea, changes in bowel or bladder habits.    ROS: As per HPI.  All other pertinent ROS negative.     Past Medical History:  Diagnosis Date  . ADHD (attention deficit hyperactivity disorder)   . Autistic spectrum disorder   . MRSA (methicillin resistant Staphylococcus aureus)   . Reflux    Past Surgical History:  Procedure Laterality Date  . MASTOIDECTOMY     Allergies  Allergen Reactions  . Clindamycin/Lincomycin    No current facility-administered medications on file prior to encounter.   Current Outpatient Medications on File Prior to Encounter  Medication Sig Dispense Refill  . atomoxetine (STRATTERA) 80 MG capsule Take 1 capsule (80 mg total) by mouth daily. 30 capsule 2  . cyclobenzaprine (FLEXERIL) 10 MG tablet Take 1 tablet (10 mg total) by mouth at bedtime. 15 tablet 0  . guanFACINE (INTUNIV) 1 MG TB24 ER tablet Take 1 tablet (1 mg total) by mouth 3 (three) times daily. 90 tablet 2  . naproxen (NAPROSYN) 500 MG tablet Take 1 tablet (500 mg total) by mouth 2 (two) times daily. 30 tablet 0   Social History   Socioeconomic History  . Marital status: Single    Spouse name: Not on file  . Number of children: Not on file  . Years of education: Not on file  . Highest education level: Not on file  Occupational History  . Not on file  Tobacco Use  . Smoking status: Never Smoker  .  Smokeless tobacco: Never Used  Substance and Sexual Activity  . Alcohol use: Never  . Drug use: Never  . Sexual activity: Never  Other Topics Concern  . Not on file  Social History Narrative  . Not on file   Social Determinants of Health   Financial Resource Strain:   . Difficulty of Paying Living Expenses:   Food Insecurity:   . Worried About Charity fundraiser in the Last Year:   . Arboriculturist in the Last Year:   Transportation Needs:   . Film/video editor (Medical):   Marland Kitchen Lack of Transportation (Non-Medical):   Physical Activity:   . Days of Exercise per Week:   . Minutes of Exercise per Session:   Stress:   . Feeling of Stress :   Social Connections:   . Frequency of Communication with Friends and Family:   . Frequency of Social Gatherings with Friends and Family:   . Attends Religious Services:   . Active Member of Clubs or Organizations:   . Attends Archivist Meetings:   Marland Kitchen Marital Status:   Intimate Partner Violence:   . Fear of Current or Ex-Partner:   . Emotionally Abused:   Marland Kitchen Physically Abused:   . Sexually Abused:    Family History  Problem Relation Age of Onset  . Alcohol abuse Father   . Drug abuse Father  OBJECTIVE:  Vitals:   09/06/19 0855  BP: 131/84  Pulse: 85  Resp: 20  Temp: 98.2 F (36.8 C)  SpO2: 97%     Physical Exam Vitals reviewed.  Constitutional:      General: She is not in acute distress.    Appearance: Normal appearance. She is normal weight. She is not ill-appearing, toxic-appearing or diaphoretic.  HENT:     Head: Normocephalic.     Right Ear: Tympanic membrane and ear canal normal. Tenderness present. There is no impacted cerumen. Tympanic membrane is not bulging.     Left Ear: External ear normal. A middle ear effusion is present. There is no impacted cerumen.     Ears:     Comments: Pain when pulling ear tragus, swelling and ear canal    Nose: Nose normal. No congestion or rhinorrhea.    Cardiovascular:     Rate and Rhythm: Normal rate and regular rhythm.     Pulses: Normal pulses.     Heart sounds: Normal heart sounds. No murmur. No friction rub. No gallop.   Pulmonary:     Effort: Pulmonary effort is normal. No respiratory distress.     Breath sounds: Normal breath sounds. No stridor. No wheezing, rhonchi or rales.  Chest:     Chest wall: No tenderness.  Neurological:     Mental Status: She is alert.    Imaging: No results found.   ASSESSMENT & PLAN:  1. Infective otitis externa of right ear   2. Acute effusion of left ear     Meds ordered this encounter  Medications  . ciprofloxacin-dexamethasone (CIPRODEX) OTIC suspension    Sig: Place 4 drops into the right ear 2 (two) times daily.    Dispense:  7.5 mL    Refill:  0  . fluticasone (FLONASE) 50 MCG/ACT nasal spray    Sig: Place 1 spray into both nostrils daily for 14 days.    Dispense:  16 g    Refill:  0   Discharge instructions Rest and drink plenty of fluids Prescribed ciprodex for ear drops Prescribed flonase for middle ear effusion Take medications as directed and to completion Continue to use OTC ibuprofen and/ or tylenol as needed for pain control Follow up with PCP if symptoms persists Return here or go to the ER if you have any new or worsening symptoms    Reviewed expectations re: course of current medical issues. Questions answered. Outlined signs and symptoms indicating need for more acute intervention. Patient verbalized understanding. After Visit Summary given.         Durward Parcel, FNP 09/06/19 0915    Durward Parcel, FNP 09/06/19 [phone]

## 2019-09-06 NOTE — Discharge Instructions (Addendum)
Rest and drink plenty of fluids Prescribed Ciprodex  for ear drops Prescribed Flonase for middle ear effusion Take medications as directed and to completion Continue to use OTC ibuprofen and/ or tylenol as needed for pain control Follow up with PCP if symptoms persists Return here or go to the ER if you have any new or worsening symptoms

## 2019-09-08 ENCOUNTER — Encounter (HOSPITAL_COMMUNITY): Payer: Self-pay | Admitting: *Deleted

## 2019-09-08 ENCOUNTER — Other Ambulatory Visit (HOSPITAL_COMMUNITY): Payer: Self-pay | Admitting: Psychiatry

## 2019-09-08 NOTE — Telephone Encounter (Signed)
Med sent in, call for appt with me

## 2019-09-10 NOTE — Telephone Encounter (Signed)
Spoke with patient and an appt was made 

## 2019-09-13 ENCOUNTER — Other Ambulatory Visit: Payer: Self-pay

## 2019-09-13 ENCOUNTER — Ambulatory Visit (HOSPITAL_COMMUNITY): Payer: Medicaid Other | Attending: Family Medicine | Admitting: Occupational Therapy

## 2019-09-13 ENCOUNTER — Encounter (HOSPITAL_COMMUNITY): Payer: Self-pay | Admitting: Occupational Therapy

## 2019-09-13 DIAGNOSIS — R29898 Other symptoms and signs involving the musculoskeletal system: Secondary | ICD-10-CM | POA: Diagnosis present

## 2019-09-13 DIAGNOSIS — M25512 Pain in left shoulder: Secondary | ICD-10-CM | POA: Diagnosis present

## 2019-09-13 NOTE — Therapy (Signed)
[facility] [address] [place], [place], [address] Phone: [phone]   Fax:  [phone]  Occupational Therapy Treatment  Patient Details  Name: Rebekah Haynes MRN: [id number] Date of Birth: 1995/06/24 Referring Provider (OT): Dr. Laroy Apple   Encounter Date: 09/13/2019  OT End of Session - 09/13/19 1158    Visit Number  2    Number of Visits  5    Date for OT Re-Evaluation  09/22/19    Authorization Type  Medicaid    Authorization Time Period  Requesting initial 3 visits    OT Start Time  1115    OT Stop Time  1153    OT Time Calculation (min)  38 min    Activity Tolerance  Patient tolerated treatment well    Behavior During Therapy  [facility] for tasks assessed/performed       Past Medical History:  Diagnosis Date  . ADHD (attention deficit hyperactivity disorder)   . Autistic spectrum disorder   . MRSA (methicillin resistant Staphylococcus aureus)   . Reflux     Past Surgical History:  Procedure Laterality Date  . MASTOIDECTOMY      There were no vitals filed for this visit.  Subjective Assessment - 09/13/19 1117    Subjective   S: It's getting better but not a lot.    Pertinent History  Pt is a 24 y/o female presenting with left shoulder pain primarily around scapula that began 07/14/19 with no known injury or cause. Pt reports minimal changes since onset, did not improve with heat or ice. MD dx pt with left trapezius strain. Pt was referred to occupational therapy for evaluation and treatment by Dr. Laroy Apple.    Currently in Pain?  Yes    Pain Score  4     Pain Location  Shoulder    Pain Orientation  Left    Pain Descriptors / Indicators  Discomfort                   OT Treatments/Exercises (OP) - 09/13/19 1130      Exercises   Exercises  Shoulder      Shoulder Exercises: Supine   Horizontal ABduction  PROM;Left;10 reps    External Rotation  PROM;Left;10 reps    Internal Rotation   PROM;Left;10 reps    Flexion  PROM;Left;10 reps    ABduction  PROM;Left;10 reps      Shoulder Exercises: Seated   Extension  AAROM;10 reps   dowel   Protraction  AROM;10 reps    Horizontal ABduction  AAROM;10 reps   dowel   External Rotation  AAROM;10 reps   dowel   Flexion  AAROM;10 reps   dowel   Abduction  AAROM;10 reps   dowel   Other Seated Exercises  Shoulder rolls x10 forwards and backwards      Shoulder Exercises: ROM/Strengthening   Wall Wash  forward flexion; 10x; 2 sets      Shoulder Exercises: Stretch   Table Stretch - Flexion  --   10x; standing; 2 sets   Table Stretch - Abduction  --   20; standing     Modalities   Modalities  Moist Heat      Moist Heat Therapy   Number Minutes Moist Heat  5 Minutes    Moist Heat Location  Shoulder      Manual Therapy   Manual Therapy  Myofascial release    Manual therapy comments  Manual techniques completed  prior to exercises.     Myofascial Release  Myofascial release and manual stretching completed to left upper arm, trapezius, and scapularis region to decrease fascial restrictions and increase joint mobility in a pain free zone.                 OT Short Term Goals - 08/23/19 1151      OT SHORT TERM GOAL #1   Title  Pt will be provided with and educated on HEP to improve functional use of LUE as non-dominant during ADLs.    Time  4    Period  Weeks    Status  New    Target Date  09/22/19      OT SHORT TERM GOAL #2   Title  Pt will decrease fascial restrictions in LUE to minimal amounts or less to improve mobility required for functional reaching tasks.    Time  4    Period  Weeks    Status  New      OT SHORT TERM GOAL #3   Title  Pt will increase LUE trapezius and scapular strength to 4+/5 or greater to improve ability to perform lifting tasks at work.    Time  4    Period  Weeks    Status  New      OT SHORT TERM GOAL #4   Title  Pt will decrease pain in LUE to 2/10 or less to improve ability to  sleep on left side for 4 consecutive hours or greater without discomfort or need to reposition.    Time  4    Period  Weeks    Status  New               Plan - 09/13/19 1159    Clinical Impression Statement  A: Started with myofascial release at upper arm, traps, and shoulder. Pt reporting pain at deltoid area and upper back. Focused on AAROM and strengthing. Pt performing AAROM woth dowel rod and then with table and wall stretches. During wall stretch, pt reporting increased pain and "needle" feeling in hands. Rested and applied heat to shoulder.    OT Occupational Profile and History  Problem Focused Assessment - Including review of records relating to presenting problem    Occupational performance deficits (Please refer to evaluation for details):  ADL's;IADL's;Rest and Sleep;Work    OT Frequency  1x / week    OT Duration  4 weeks    Plan  P:  continue myofascial release, passive stretching, AROM, scapular strengthing, and trapezius strengthing.       Patient will benefit from skilled therapeutic intervention in order to improve the following deficits and impairments:           Visit Diagnosis: Other symptoms and signs involving the musculoskeletal system  Acute pain of left shoulder    Problem List Patient Active Problem List   Diagnosis Date Noted  . Attention deficit hyperactivity disorder (ADHD) 04/30/2018  . Autistic spectrum disorder 04/30/2018    Neal Dy, MSOT, OTR/L 09/13/2019, 12:15 PM  [facility] [address] [place], [place], [address] Phone: [phone]   Fax:  [phone]  Name: Rebekah Haynes MRN: [id number] Date of Birth: July 08, 1995

## 2019-09-20 ENCOUNTER — Telehealth (INDEPENDENT_AMBULATORY_CARE_PROVIDER_SITE_OTHER): Payer: Medicaid Other | Admitting: Psychiatry

## 2019-09-20 ENCOUNTER — Encounter (HOSPITAL_COMMUNITY): Payer: Self-pay | Admitting: Psychiatry

## 2019-09-20 ENCOUNTER — Other Ambulatory Visit: Payer: Self-pay

## 2019-09-20 DIAGNOSIS — F840 Autistic disorder: Secondary | ICD-10-CM | POA: Diagnosis not present

## 2019-09-20 DIAGNOSIS — F900 Attention-deficit hyperactivity disorder, predominantly inattentive type: Secondary | ICD-10-CM

## 2019-09-20 MED ORDER — ATOMOXETINE HCL 80 MG PO CAPS: ORAL_CAPSULE | ORAL | 3 refills | Status: DC

## 2019-09-20 MED ORDER — GUANFACINE HCL ER 1 MG PO TB24: 1.0000 mg | ORAL_TABLET | Freq: Three times a day (TID) | ORAL | 3 refills | Status: DC

## 2019-09-20 NOTE — Progress Notes (Addendum)
Virtual Visit via Video Note  I connected with Rebekah Haynes on 09/20/19 at  2:10 PM EDT by a video enabled telemedicine application and verified that I am speaking with the correct person using two identifiers.   I discussed the limitations of evaluation and management by telemedicine and the availability of in person appointments. The patient expressed understanding and agreed to proceed    I discussed the assessment and treatment plan with the patient. The patient was provided an opportunity to ask questions and all were answered. The patient agreed with the plan and demonstrated an understanding of the instructions.   The patient was advised to call back or seek an in-person evaluation if the symptoms worsen or if the condition fails to improve as anticipated.  I provided 15 minutes of non-face-to-face time during this encounter. Location: Provider office patient home  Diannia Ruder, MD  [facility] MD/PA/NP OP Progress Note  09/20/2019 2:29 PM Rebekah Haynes  MRN:  [id number]  Chief Complaint:  Chief Complaint    ADHD     HPI: This patient is a 24 year old single white female who lives with her mother stepfather and 66 year old sister in [place]. She only sees her father very rarely. She works part-time in the [employer]. She is on disability for autistic spectrum disorder and ADHD. Her mother is currently her payee.  The patient and mother are self-referred. The patient went to the psychiatric and counseling center in [place] for many years and saw Dr. [name]. She was originally diagnosed with ADHD as a very young child and later in high school with autistic spectrum disorder. In the past she had been on medications to help with focus and impulsivity such as Strattera and Intuniv. She had a lapse in her Medicaid and is now back on it after 3 years and would like to return to using the medications.  The patient presents today with her mother. The mother states  that her pregnancy with this patient was normal and she did not use drugs alcohol or medications. The patient was born full-term without difficulty she was a difficult colicky baby who cried a lot and was hard to soothe. Her milestones in terms of walking and gross motor skills were normal but her speech was quite garbled and she really did not put words together until approximately age 35. She had speech therapy in elementary school as well as occupational therapy because of poor fine motor skills. According to mother she did not have cognitive delays. She was always a child to stay to her self and and did not interact much with other people. She is always had difficulty with transitions and did not like crowds or noises and does best when things are predictable and the same  The patient was tried on stimulants when she was young because she was very hyperactive over talkative and disruptive in class. The stimulants made her "too shut down" and she was switched to Strattera which she stayed out all the way through high school. She was also on intunivwhich seemed to help her impulsivity. She has been off these medications for 3 years due to insurance lapse. 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 Her goal is to eventually build computers but she does not seem to enthusiastic about going back into  college full-time. She spends most of her time playing video games watching [employer]. All of her friends are online. She does not use drugs alcohol cigarettes and is not sexually active. She denies depression or anxiety currently.  The mother states that she and her husband would like to see the patient become more independent. The patient has worked with vocational rehab and they have done some testing and they helped  her get her job. The patient has a driver's license but is too anxious and scared to drive. Her mother does all her money but she tends to spend too much on food. She has gained 14 pounds in the last year. Cognitively it seems that she could manage things on her own but she tends to make impulsive decisions. For now I suggested that we get her back on medication and see what her level of function is and also review her testing.  The patient returns for follow-up after about 6 months.  She has missed some appointments.  She states that she pulled a muscle in her upper back and is undergoing physical therapy.  She is not sure how this occurred.  For the most part however she is enjoying her job.  Her mood has been stable and she denies being depressed or anxious.  She is no longer taking any classes.  She is sleeping well at night.  She denies being depressed or anxious or having any thoughts of self-harm or suicide. Visit Diagnosis:    ICD-10-CM   1. Attention deficit hyperactivity disorder (ADHD), predominantly inattentive type  F90.0   2. Autistic spectrum disorder  F84.0     Past Psychiatric History: The patient was treated by Dr. [name] in [place] for approximately 7 years with a diagnosis of ADHD and autism.  For most of this time she was on Strattera 80 mg daily and Intuniv 3 mg daily  Past Medical History:  Past Medical History:  Diagnosis Date  . ADHD (attention deficit hyperactivity disorder)   . Autistic spectrum disorder   . MRSA (methicillin resistant Staphylococcus aureus)   . Reflux     Past Surgical History:  Procedure Laterality Date  . MASTOIDECTOMY      Family Psychiatric History: see below  Family History:  Family History  Problem Relation Age of Onset  . Alcohol abuse Father   . Drug abuse Father     Social History:  Social History   Socioeconomic History  . Marital status: Single    Spouse name: Not on file  . Number of children: Not on file  . Years  of education: Not on file  . Highest education level: Not on file  Occupational History  . Not on file  Tobacco Use  . Smoking status: Never Smoker  . Smokeless tobacco: Never Used  Vaping Use  . Vaping Use: Never used  Substance and Sexual Activity  . Alcohol use: Never  . Drug use: Never  . Sexual activity: Never  Other Topics Concern  . Not on file  Social History Narrative  . Not on file   Social Determinants of Health   Financial Resource Strain:   . Difficulty of Paying Living Expenses:   Food Insecurity:   . Worried About Programme researcher, broadcasting/film/video in the Last Year:   . Barista in the Last Year:   Transportation Needs:   . Freight forwarder (Medical):   Marland Kitchen Lack of Transportation (Non-Medical):   Physical Activity:   . Days  of Exercise per Week:   . Minutes of Exercise per Session:   Stress:   . Feeling of Stress :   Social Connections:   . Frequency of Communication with Friends and Family:   . Frequency of Social Gatherings with Friends and Family:   . Attends Religious Services:   . Active Member of Clubs or Organizations:   . Attends Archivist Meetings:   Marland Kitchen Marital Status:     Allergies:  Allergies  Allergen Reactions  . Clindamycin/Lincomycin     Metabolic Disorder Labs: No results found for: HGBA1C, MPG No results found for: PROLACTIN No results found for: CHOL, TRIG, HDL, CHOLHDL, VLDL, LDLCALC Lab Results  Component Value Date   TSH 1.660 01/21/2017    Therapeutic Level Labs: No results found for: LITHIUM No results found for: VALPROATE No components found for:  CBMZ  Current Medications: Current Outpatient Medications  Medication Sig Dispense Refill  . atomoxetine (STRATTERA) 80 MG capsule TAKE (1) CAPSULE BY MOUTH ONCE DAILY. 30 capsule 3  . ciprofloxacin-dexamethasone (CIPRODEX) OTIC suspension Place 4 drops into the right ear 2 (two) times daily. 7.5 mL 0  . cyclobenzaprine (FLEXERIL) 10 MG tablet Take 1 tablet (10  mg total) by mouth at bedtime. 15 tablet 0  . fluticasone (FLONASE) 50 MCG/ACT nasal spray Place 1 spray into both nostrils daily for 14 days. 16 g 0  . guanFACINE (INTUNIV) 1 MG TB24 ER tablet Take 1 tablet (1 mg total) by mouth 3 (three) times daily. 90 tablet 3  . naproxen (NAPROSYN) 500 MG tablet Take 1 tablet (500 mg total) by mouth 2 (two) times daily. 30 tablet 0   No current facility-administered medications for this visit.     Musculoskeletal: Strength & Muscle Tone: within normal limits Gait & Station: normal Patient leans: N/A  Psychiatric Specialty Exam: Review of Systems  Musculoskeletal: Positive for back pain.  All other systems reviewed and are negative.   There were no vitals taken for this visit.There is no height or weight on file to calculate BMI.  General Appearance: Casual and Fairly Groomed  Eye Contact:  Fair  Speech:  Clear and Coherent  Volume:  Normal  Mood:  Euthymic  Affect:  Appropriate and Congruent  Thought Process:  Goal Directed  Orientation:  Full (Time, Place, and Person)  Thought Content: WDL   Suicidal Thoughts:  No  Homicidal Thoughts:  No  Memory:  Immediate;   Good Recent;   Good Remote;   Fair  Judgement:  Good  Insight:  Shallow  Psychomotor Activity:  Normal  Concentration:  Concentration: Good and Attention Span: Good  Recall:  Good  Fund of Knowledge: Good  Language: Good  Akathisia:  No  Handed:  Right  AIMS (if indicated): not done  Assets:  Communication Skills Desire for Improvement Physical Health Resilience Social Support Talents/Skills  ADL's:  Intact  Cognition: WNL  Sleep:  Good   Screenings: PHQ2-9     Office Visit from 03/06/2017 in [facility]  PHQ-2 Total Score 0       Assessment and Plan: This patient is a 24 year old female with a history of ADD and high functioning autistic disorder.  She continues to do well on her current regimen.  She will continue Strattera 80 mg daily as  well as Intuniv 1 mg 3 times daily both for ADD.  She will return to see me in 4 months   Levonne Spiller, MD 09/20/2019, 2:29 PM

## 2019-09-21 ENCOUNTER — Other Ambulatory Visit: Payer: Self-pay

## 2019-09-21 ENCOUNTER — Ambulatory Visit (HOSPITAL_COMMUNITY): Payer: Medicaid Other | Admitting: Occupational Therapy

## 2019-09-21 DIAGNOSIS — R29898 Other symptoms and signs involving the musculoskeletal system: Secondary | ICD-10-CM

## 2019-09-21 DIAGNOSIS — M25512 Pain in left shoulder: Secondary | ICD-10-CM

## 2019-09-21 NOTE — Therapy (Signed)
[place], [place], [address] Phone: [phone]   Fax:  [phone]  Occupational Therapy Treatment  Patient Details  Name: Rebekah Haynes MRN: [id number] Date of Birth: [birth year] Referring Provider (OT): Dr. Elvia Collum   Encounter Date: 09/21/2019   OT End of Session - 09/21/19 1234    Visit Number 4    Number of Visits 5    Date for OT Re-Evaluation 09/22/19    Authorization Type Medicaid    Authorization Time Period Requesting initial 3 visits    OT Start Time [phone]    OT Stop Time 1030    OT Time Calculation (min) 34 min    Activity Tolerance Patient tolerated treatment well    Behavior During Therapy [facility] for tasks assessed/performed           Past Medical History:  Diagnosis Date  . ADHD (attention deficit hyperactivity disorder)   . Autistic spectrum disorder   . MRSA (methicillin resistant Staphylococcus aureus)   . Reflux     Past Surgical History:  Procedure Laterality Date  . MASTOIDECTOMY      There were no vitals filed for this visit.   Subjective Assessment - 09/21/19 0959    Subjective  S: After the last session I was so sore.    Currently in Pain? Yes    Pain Score 5     Pain Location Shoulder    Pain Orientation Left                        OT Treatments/Exercises (OP) - 09/21/19 1009      Exercises   Exercises Shoulder      Shoulder Exercises: Supine   Horizontal ABduction PROM;Left;10 reps    External Rotation PROM;Left;10 reps    Internal Rotation PROM;Left;10 reps    Flexion PROM;Left;10 reps    ABduction PROM;Left;10 reps      Shoulder Exercises: Seated   Elevation AROM;10 reps    Retraction AROM;10 reps    Protraction AROM;10 reps      Shoulder Exercises: Standing   Extension Strengthening;Theraband;15 reps    Theraband Level (Shoulder Extension) Level 2 (Red)    Row Strengthening;15 reps;Theraband    Theraband Level (Shoulder Row)  Level 2 (Red)    Retraction Strengthening;15 reps;Theraband    Theraband Level (Shoulder Retraction) Level 2 (Red)      Shoulder Exercises: Therapy Ball   Flexion Other (comment)   1 min; 2x; first with blue ball and second with green ball   ABduction Other (comment)   1 min; 2x; first with blue ball and second with green ball     Shoulder Exercises: ROM/Strengthening   Wall Wash forward flexion 10x; abduction 10x    Wall Pushups 10 reps   In door frame   Proximal Shoulder Strengthening, Seated vertical flutters 1'. horizontal flutters 1'      Manual Therapy   Manual Therapy Myofascial release    Manual therapy comments Manual techniques completed prior to exercises.     Myofascial Release Myofascial release and manual stretching completed to left upper arm, trapezius, and scapularis region to decrease fascial restrictions and increase joint mobility in a pain free zone.                      OT Short Term Goals - 08/23/19 1151      OT SHORT TERM GOAL #1   Title Pt  will be provided with and educated on HEP to improve functional use of LUE as non-dominant during ADLs.    Time 4    Period Weeks    Status New    Target Date 09/22/19      OT SHORT TERM GOAL #2   Title Pt will decrease fascial restrictions in LUE to minimal amounts or less to improve mobility required for functional reaching tasks.    Time 4    Period Weeks    Status New      OT SHORT TERM GOAL #3   Title Pt will increase LUE trapezius and scapular strength to 4+/5 or greater to improve ability to perform lifting tasks at work.    Time 4    Period Weeks    Status New      OT SHORT TERM GOAL #4   Title Pt will decrease pain in LUE to 2/10 or less to improve ability to sleep on left side for 4 consecutive hours or greater without discomfort or need to reposition.    Time 4    Period Weeks    Status New                    Plan - 09/21/19 1113    Clinical Impression Statement A:  Starting  with myofasical relase and PROM. Continued AAROM and strengthening through therapy ball stretch, wall push ups, theraband exercises, and wall washes. Pt demonstrating increased activity tolerance this session. Providing verbal and tactile cues throughout for form and technique.    Body Structure / Function / Physical Skills ADL;Endurance;UE functional use;Fascial restriction;Pain;ROM;IADL;Strength    Plan P: Continue passive stretching, AAROM, scapular strengthening, and trapezius strengthening.           Patient will benefit from skilled therapeutic intervention in order to improve the following deficits and impairments:   Body Structure / Function / Physical Skills: ADL, Endurance, UE functional use, Fascial restriction, Pain, ROM, IADL, Strength       Visit Diagnosis: Other symptoms and signs involving the musculoskeletal system  Acute pain of left shoulder    Problem List Patient Active Problem List   Diagnosis Date Noted  . Attention deficit hyperactivity disorder (ADHD) 04/30/2018  . Autistic spectrum disorder 04/30/2018    Gabriel Rung, MSOT, OTR/L 09/21/2019, 12:56 PM  [facility] [address] [place], [place], [address] Phone: [phone]   Fax:  [phone]  Name: Rebekah Haynes MRN: [id number] Date of Birth: 06/27/1995

## 2019-09-28 ENCOUNTER — Ambulatory Visit (HOSPITAL_COMMUNITY): Payer: Medicaid Other | Admitting: Specialist

## 2019-10-08 ENCOUNTER — Other Ambulatory Visit: Payer: Self-pay

## 2019-10-08 ENCOUNTER — Ambulatory Visit (HOSPITAL_COMMUNITY): Payer: Medicaid Other | Attending: Family Medicine | Admitting: Occupational Therapy

## 2019-10-08 ENCOUNTER — Encounter (HOSPITAL_COMMUNITY): Payer: Self-pay | Admitting: Occupational Therapy

## 2019-10-08 DIAGNOSIS — M25512 Pain in left shoulder: Secondary | ICD-10-CM | POA: Diagnosis present

## 2019-10-08 DIAGNOSIS — R29898 Other symptoms and signs involving the musculoskeletal system: Secondary | ICD-10-CM | POA: Insufficient documentation

## 2019-10-08 NOTE — Addendum Note (Signed)
Addended by: Shirlean Mylar on: 10/08/2019 01:32 PM   Modules accepted: Orders

## 2019-10-08 NOTE — Therapy (Addendum)
[facility] [address] [place], [place], [address] Phone: [phone]   Fax:  [phone]  Occupational Therapy Treatment  Patient Details  Name: HYDEIA MCATEE MRN: [id number] Date of Birth: 1995/10/29 Referring Provider (OT): Dr. Laroy Apple   Encounter Date: 10/08/2019   OT End of Session - 10/08/19 0944    Visit Number 5    Number of Visits 5    Date for OT Re-Evaluation 11/05/19    Authorization Type Medicaid    Authorization Time Period Requesting 4 additional visit    OT Start Time 0902    OT Stop Time 0941    OT Time Calculation (min) 39 min    Activity Tolerance Patient tolerated treatment well    Behavior During Therapy [facility] for tasks assessed/performed           Past Medical History:  Diagnosis Date  . ADHD (attention deficit hyperactivity disorder)   . Autistic spectrum disorder   . MRSA (methicillin resistant Staphylococcus aureus)   . Reflux     Past Surgical History:  Procedure Laterality Date  . MASTOIDECTOMY      There were no vitals filed for this visit.   Subjective Assessment - 10/08/19 0904    Subjective  S: I haven't been up to much. I have found a new book I like about [place] and [place] philosophers    Pertinent History Pt is a 24 y/o female presenting with left shoulder pain primarily around scapula that began 07/14/19 with no known injury or cause. Pt reports minimal changes since onset, did not improve with heat or ice. MD dx pt with left trapezius strain. Pt was referred to occupational therapy for evaluation and treatment by Dr. Laroy Apple.    Currently in Pain? Yes    Pain Score 3     Pain Location Shoulder    Pain Orientation Left    Pain Descriptors / Indicators Discomfort    Pain Type Acute pain              [facility] OT Assessment - 10/08/19 0001      Assessment   Medical Diagnosis left trapezius strain    Referring Provider (OT) Dr. Laroy Apple      Precautions    Precautions None                    OT Treatments/Exercises (OP) - 10/08/19 0906      Exercises   Exercises Shoulder      Shoulder Exercises: Supine   Horizontal ABduction PROM;Left;10 reps    External Rotation PROM;Left;10 reps    Internal Rotation PROM;Left;10 reps    Flexion PROM;Left;10 reps    ABduction PROM;Left;10 reps      Shoulder Exercises: Seated   Elevation AROM;10 reps    Retraction AROM;10 reps    Protraction AROM;10 reps    Other Seated Exercises Shoulder rolls x10 forwards and backwards      Shoulder Exercises: Standing   Horizontal ABduction AROM;10 reps;Theraband    Theraband Level (Shoulder Horizontal ABduction) Level 2 (Red)    Extension Strengthening;Theraband;15 reps    Theraband Level (Shoulder Extension) Level 2 (Red)    Row Strengthening;15 reps;Theraband    Theraband Level (Shoulder Row) Level 2 (Red)      Shoulder Exercises: Therapy Ball   Flexion Other (comment)   1 min; blue ball   ABduction Other (comment)   1 min; blue ball     Shoulder Exercises: ROM/Strengthening  Wall Wash forward flexion 10x; abduction 10x    Over Head Lace 2' lacing; 2' taking out    Wall Pushups 10 reps   At counter   Proximal Shoulder Strengthening, Seated vertical flutters 1'. horizontal flutters 1'. clockwise circles 1'. counterclockwise 1'.      Manual Therapy   Manual Therapy Myofascial release    Manual therapy comments Manual techniques completed prior to exercises.     Myofascial Release Myofascial release and manual stretching completed to left upper arm, trapezius, and scapularis region to decrease fascial restrictions and increase joint mobility in a pain free zone.                      OT Short Term Goals - 08/23/19 1151      OT SHORT TERM GOAL #1   Title Pt will be provided with and educated on HEP to improve functional use of LUE as non-dominant during ADLs.    Time 4    Period Weeks    Status New    Target Date 09/22/19        OT SHORT TERM GOAL #2   Title Pt will decrease fascial restrictions in LUE to minimal amounts or less to improve mobility required for functional reaching tasks.    Time 4    Period Weeks    Status New      OT SHORT TERM GOAL #3   Title Pt will increase LUE trapezius and scapular strength to 4+/5 or greater to improve ability to perform lifting tasks at work.    Time 4    Period Weeks    Status New      OT SHORT TERM GOAL #4   Title Pt will decrease pain in LUE to 2/10 or less to improve ability to sleep on left side for 4 consecutive hours or greater without discomfort or need to reposition.    Time 4    Period Weeks    Status New                    Plan - 10/08/19 1212    Clinical Impression Statement A:  Starting with myofasical relase and PROM. Continued AAROM and strengthening through therapy ball stretch, wall push ups at counter, theraband exercises, and wall washes. proximal strengethening with arms on fire. Pt demonstrating increased activity tolerance and decreased pain this session. Introduced functional overhead reach with lacing task for 4 min. Providing verbal and tactile cues throughout for form and technique.    Body Structure / Function / Physical Skills ADL;Endurance;UE functional use;Fascial restriction;Pain;ROM;IADL;Strength    OT Frequency 1x / week    OT Duration 4 weeks    Plan P: Continue passive stretching, AAROM, scapular strengthening, and trapezius strengthening. Requesting additional visits.    OT Home Exercise Plan eval: shoulder stretches, red scapular theraband           Patient will benefit from skilled therapeutic intervention in order to improve the following deficits and impairments:   Body Structure / Function / Physical Skills: ADL, Endurance, UE functional use, Fascial restriction, Pain, ROM, IADL, Strength       Visit Diagnosis: Other symptoms and signs involving the musculoskeletal system  Acute pain of left  shoulder    Problem List Patient Active Problem List   Diagnosis Date Noted  . Attention deficit hyperactivity disorder (ADHD) 04/30/2018  . Autistic spectrum disorder 04/30/2018    Theodoro Grist Harolyn Cocker, MSOT, OTR/L 10/08/2019, 12:47 PM  [facility] [address] [place], [place], [address] Phone: [phone]   Fax:  [phone]  Name: SAKARA LEHTINEN MRN: [id number] Date of Birth: 1996-01-01

## 2019-10-15 ENCOUNTER — Other Ambulatory Visit: Payer: Self-pay

## 2019-10-15 ENCOUNTER — Emergency Department (HOSPITAL_COMMUNITY)
Admission: EM | Admit: 2019-10-15 | Discharge: 2019-10-15 | Disposition: A | Discharge status: Home / Self Care | Payer: Medicaid Other | Attending: Emergency Medicine | Admitting: Emergency Medicine

## 2019-10-15 ENCOUNTER — Encounter (HOSPITAL_COMMUNITY): Payer: Self-pay

## 2019-10-15 DIAGNOSIS — F909 Attention-deficit hyperactivity disorder, unspecified type: Secondary | ICD-10-CM | POA: Insufficient documentation

## 2019-10-15 DIAGNOSIS — S4992XA Unspecified injury of left shoulder and upper arm, initial encounter: Secondary | ICD-10-CM | POA: Diagnosis present

## 2019-10-15 DIAGNOSIS — Y9389 Activity, other specified: Secondary | ICD-10-CM | POA: Insufficient documentation

## 2019-10-15 DIAGNOSIS — M549 Dorsalgia, unspecified: Secondary | ICD-10-CM

## 2019-10-15 DIAGNOSIS — S46912A Strain of unspecified muscle, fascia and tendon at shoulder and upper arm level, left arm, initial encounter: Secondary | ICD-10-CM | POA: Insufficient documentation

## 2019-10-15 DIAGNOSIS — Z79899 Other long term (current) drug therapy: Secondary | ICD-10-CM | POA: Diagnosis not present

## 2019-10-15 DIAGNOSIS — F840 Autistic disorder: Secondary | ICD-10-CM | POA: Insufficient documentation

## 2019-10-15 DIAGNOSIS — X500XXA Overexertion from strenuous movement or load, initial encounter: Secondary | ICD-10-CM | POA: Diagnosis not present

## 2019-10-15 DIAGNOSIS — Y999 Unspecified external cause status: Secondary | ICD-10-CM | POA: Insufficient documentation

## 2019-10-15 DIAGNOSIS — Y9259 Other trade areas as the place of occurrence of the external cause: Secondary | ICD-10-CM | POA: Insufficient documentation

## 2019-10-15 DIAGNOSIS — T148XXA Other injury of unspecified body region, initial encounter: Secondary | ICD-10-CM

## 2019-10-15 MED ORDER — METHOCARBAMOL 500 MG PO TABS
500.0000 mg | ORAL_TABLET | Freq: Two times a day (BID) | ORAL | 0 refills | Status: DC
Start: 2019-10-15 — End: 2020-01-24

## 2019-10-15 NOTE — ED Triage Notes (Signed)
Pt to er, pt states that she has some back/ shoulder pain, states that she has been to her pmd and urgent care and has been in PT for some time, states that the pain got worse on Tuesday.  States that working made the pain worse.

## 2019-10-15 NOTE — ED Notes (Signed)
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

## 2019-10-15 NOTE — Discharge Instructions (Addendum)
At this time there does not appear to be the presence of an emergent medical condition, however there is always the potential for conditions to change. Please read and follow the below instructions.  Please return to the Emergency Department immediately for any new or worsening symptoms. Please be sure to follow up with your Primary Care Provider within one week regarding your visit today; please call their office to schedule an appointment even if you are feeling better for a follow-up visit. You may use the muscle relaxer Robaxin as prescribed to help with your symptoms.  Do not drive or operate heavy machinery while taking Robaxin as it will make you drowsy.  Do not drink alcohol or take other sedating medications while taking Robaxin as this will worsen side effects. You may follow-up with the orthopedic doctor, Dr. Romeo Apple as needed for further evaluation.  Please continue your physical therapy and see your primary care doctor for follow-up visit.  Get help right away if: You develop new bowel or bladder control problems. You have unusual weakness or numbness in your arms or legs. You develop nausea or vomiting. You develop abdominal pain. You feel faint. You have any of these problems in your injured area: You have numbness. You have tingling. You lose a lot of strength. You have fever or chills You have any new/concerning or worsening of symptoms.  Please read the additional information packets attached to your discharge summary.  Do not take your medicine if  develop an itchy rash, swelling in your mouth or lips, or difficulty breathing; call 911 and seek immediate emergency medical attention if this occurs.  You may review your lab tests and imaging results in their entirety on your MyChart account.  Please discuss all results of fully with your primary care provider and other specialist at your follow-up visit.  Note: Portions of this text may have been transcribed using voice  recognition software. Every effort was made to ensure accuracy; however, inadvertent computerized transcription errors may still be present.

## 2019-10-15 NOTE — ED Provider Notes (Addendum)
[facility] EMERGENCY DEPARTMENT Provider Note   CSN: [id number] Arrival date & time: 10/15/19  1053     History Chief Complaint  Patient presents with  . Back Pain    Rebekah Haynes is a 24 y.o. female presents today with left upper back pain and left shoulder pain ongoing for several months.  Patient reports that she was diagnosed with trapezius muscle strain by her primary care doctor and by an urgent care.  She had been treated with muscle relaxers a few months ago which improved her symptoms temporarily.  Recently patient has been going to physical therapy to help with her muscle strain and has had some improvement.  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  She reports that over the past 3 days after her string of shifts that she has had increased pain, she cannot go to her primary care doctor's office today because they were closed.  She describes an aching sensation of her left upper back and left shoulder which is constant nonradiating worsened with movement and palpation improved with rest and is moderate in intensity.  She reports this is exactly similar to her previous pain due to her muscle strain.  Denies fever/chills, fall/injury, numbness/tingling, weakness, saddle or paresthesias, bowel/bladder incontinence, urinary retention or any additional concerns.  HPI     Past Medical History:  Diagnosis Date  . ADHD (attention deficit hyperactivity disorder)   . Autistic spectrum disorder   . MRSA (methicillin resistant Staphylococcus aureus)   . Reflux     Patient Active Problem List   Diagnosis Date Noted  . Attention deficit hyperactivity disorder (ADHD) 04/30/2018  . Autistic spectrum disorder 04/30/2018    Past Surgical History:  Procedure Laterality Date  . MASTOIDECTOMY       OB History   No obstetric history on file.     Family History  Problem Relation Age of Onset  . Alcohol  abuse Father   . Drug abuse Father     Social History   Tobacco Use  . Smoking status: Never Smoker  . Smokeless tobacco: Never Used  Vaping Use  . Vaping Use: Never used  Substance Use Topics  . Alcohol use: Never  . Drug use: Never    Home Medications Prior to Admission medications   Medication Sig Start Date End Date Taking? Authorizing Provider  atomoxetine (STRATTERA) 80 MG capsule TAKE (1) CAPSULE BY MOUTH ONCE DAILY. 09/20/19   Myrlene Broker, MD  ciprofloxacin-dexamethasone (CIPRODEX) OTIC suspension Place 4 drops into the right ear 2 (two) times daily. 09/06/19   Avegno, Zachery Dakins, FNP  fluticasone (FLONASE) 50 MCG/ACT nasal spray Place 1 spray into both nostrils daily for 14 days. 09/06/19 09/20/19  Avegno, Zachery Dakins, FNP  guanFACINE (INTUNIV) 1 MG TB24 ER tablet Take 1 tablet (1 mg total) by mouth 3 (three) times daily. 09/20/19   Myrlene Broker, MD  methocarbamol (ROBAXIN) 500 MG tablet Take 1 tablet (500 mg total) by mouth 2 (two) times daily. 10/15/19   Harlene Salts A, PA-C  naproxen (NAPROSYN) 500 MG tablet Take 1 tablet (500 mg total) by mouth 2 (two) times daily. 07/14/19   Wurst, [place], PA-C    Allergies    Clindamycin/lincomycin  Review of Systems   Review of Systems  Constitutional: Negative.  Negative for chills and fever.  Musculoskeletal: Positive for arthralgias and back pain. Negative for joint swelling and neck  pain.  Skin: Negative.  Negative for color change and wound.  Neurological: Negative.  Negative for weakness and numbness.    Physical Exam Updated Vital Signs BP (!) 134/98 (BP Location: Right Arm)   Pulse 91   Temp 98.7 F (37.1 C) (Oral)   Resp 17   Ht 5\' 2"  (1.575 m)   Wt 74.8 kg   LMP 10/11/2019 (Exact Date) Comment: pt is not seexually active   SpO2 98%   BMI 30.18 kg/m   Physical Exam Constitutional:      General: She is not in acute distress.    Appearance: Normal appearance. She is well-developed. She is not  ill-appearing or diaphoretic.  HENT:     Head: Normocephalic and atraumatic.  Eyes:     General: Vision grossly intact. Gaze aligned appropriately.     Pupils: Pupils are equal, round, and reactive to light.  Neck:     Trachea: Trachea and phonation normal.  Pulmonary:     Effort: Pulmonary effort is normal. No respiratory distress.  Abdominal:     General: There is no distension.     Palpations: Abdomen is soft.     Tenderness: There is no abdominal tenderness. There is no guarding or rebound.  Musculoskeletal:        General: Normal range of motion.     Cervical back: Normal range of motion.     Comments: No midline C/T/L spinal tenderness to palpation, no deformity, crepitus, or step-off noted. No sign of injury to the neck or back. - Left trapezius and left rhomboid muscular tenderness to palpation without overlying skin change.  No bony tenderness of the back, collarbone or shoulder.  No overlying skin changes.  Radial pulses intact and equal bilaterally.  Good capillary refill and sensation to all fingers.  Compartments are soft.  Strong equal grip strength.  No pain with movement of the hand, wrist, elbow.  Range of motion of the left shoulder is intact, patient can touch fingers above head and behind back without difficulty.  Skin:    General: Skin is warm and dry.  Neurological:     Mental Status: She is alert.     GCS: GCS eye subscore is 4. GCS verbal subscore is 5. GCS motor subscore is 6.     Comments: Speech is clear and goal oriented, follows commands Major Cranial nerves without deficit, no facial droop Normal strength in upper and lower extremities bilaterally including dorsiflexion and plantar flexion, strong and equal grip strength Sensation normal to light and sharp touch Moves extremities without ataxia, coordination intact  Psychiatric:        Behavior: Behavior normal.    ED Results / Procedures / Treatments   Labs (all labs ordered are listed, but only  abnormal results are displayed) Labs Reviewed - No data to display  EKG None  Radiology No results found.  Procedures Procedures (including critical care time)  Medications Ordered in ED Medications - No data to display  ED Course  I have reviewed the triage vital signs and the nursing notes.  Pertinent labs & imaging results that were available during my care of the patient were reviewed by me and considered in my medical decision making (see chart for details).    MDM Rules/Calculators/A&P                         Additional History Obtained: 1. Nursing notes from this visit. 2. Family, patient's mother at  bedside. 3. Reviewed EMR, patient had an outpatient rehab visit on October 08, 2019, diagnosis of left shoulder pain musculoskeletal. 4. Reviewed urgent care visit from July 14, 2019, diagnosis of left upper back pain.  Discharged with cyclobenzaprine and naproxen.    Rebekah Haynes is a 24 y.o. female presents with acute on chronic left upper back pain.  She describes this as her typical pain.  She has consistently reproducible tenderness of the left trapezius and rhomboid muscles. Patient denies history of trauma, fever, IV drug use, night sweats, weight loss, cancer, saddle anesthesia, urinary rentention, bowel/bladder incontinence. No neurological deficits and normal neuro exam. Abdomen soft/nontender and without pulsatile mass. Patient with equal pedal pulses.  Doubt spinal epidural abscess, cauda equina, AAA, septic arthritis, compartment syndrome, neurovascular compromise or other emergent pathologies.  Patient reports that she has not had an x-ray of her left shoulder before, offered x-ray today however patient and her mother refused any imaging.  They request a work note and plan to follow-up with PCP and continue symptomatic therapies.  I feel this is reasonable choice I have low suspicion for fracture/dislocation or other acute injury requiring imaging today.  Suspect  muscle strain.  Will prescribe Robaxin 500mg  BID prescribed. Patient informed to avoid driving or operating heavy machinery while taking muscle relaxer.  Patient will continue her physical therapy and OTC anti-inflammatories.  Encouraged to follow-up with PCP referral given to Ortho as needed.  Of note patient denies chance of pregnancy today.  Patient states understanding that medications given or prescribed today may result in harm to of a pregnancy and she accepts these risks and still chooses not to be pregnancy tested and proceed with medications.  Additionally patient denies history of CKD or gastric ulcers.  At this time there does not appear to be any evidence of an acute emergency medical condition and the patient appears stable for discharge with appropriate outpatient follow up. Diagnosis was discussed with patient who verbalizes understanding of care plan and is agreeable to discharge. I have discussed return precautions with patient and mother who verbalizes understanding. Patient encouraged to follow-up with their PCP. All questions answered.   Note: Portions of this report may have been transcribed using voice recognition software. Every effort was made to ensure accuracy; however, inadvertent computerized transcription errors may still be present. Final Clinical Impression(s) / ED Diagnoses Final diagnoses:  Muscle strain  Upper back pain on left side    Rx / DC Orders ED Discharge Orders         Ordered    methocarbamol (ROBAXIN) 500 MG tablet  2 times daily     Discontinue  Reprint     10/15/19 1240           12/16/19, PA-C 10/15/19 1240    [address] 10/15/19 1241    12/16/19, MD 10/16/19 [phone]

## 2019-10-26 ENCOUNTER — Encounter (HOSPITAL_COMMUNITY): Payer: Medicaid Other | Admitting: Occupational Therapy

## 2019-10-29 ENCOUNTER — Telehealth (HOSPITAL_COMMUNITY): Payer: Self-pay | Admitting: Occupational Therapy

## 2019-10-29 ENCOUNTER — Telehealth: Payer: Self-pay | Admitting: Family Medicine

## 2019-10-29 DIAGNOSIS — M7918 Myalgia, other site: Secondary | ICD-10-CM

## 2019-10-29 NOTE — Telephone Encounter (Signed)
Please advise. Thank you

## 2019-10-29 NOTE — Telephone Encounter (Signed)
May have referral as requested for musculoskeletal pain

## 2019-10-29 NOTE — Telephone Encounter (Signed)
pt cancelled appt for today, no reason given 

## 2019-10-29 NOTE — Telephone Encounter (Signed)
We referred patient for physical at  but patient wants to switch to a different place and needs a new referral.  Wants to go to [facility] at [facility], physical therapist there. She did not know the name of the business. (Sorry [place])

## 2019-10-29 NOTE — Telephone Encounter (Signed)
Referral placed. Left message to return call 

## 2019-11-01 NOTE — Telephone Encounter (Signed)
Grandmother(DPR) notified and verbalized understanding.

## 2019-11-02 ENCOUNTER — Encounter (HOSPITAL_COMMUNITY): Payer: Medicaid Other | Admitting: Occupational Therapy

## 2019-11-09 ENCOUNTER — Telehealth: Payer: Self-pay | Admitting: Family Medicine

## 2019-11-09 NOTE — Telephone Encounter (Signed)
Please sign order for PT & forward to [place] to be faxed  In red folder in basket on wall in Dr. Roby Lofts office

## 2019-11-10 NOTE — Telephone Encounter (Signed)
This was signed thank you 

## 2019-11-11 ENCOUNTER — Encounter: Payer: Self-pay | Admitting: Family Medicine

## 2019-11-30 ENCOUNTER — Encounter: Payer: Self-pay | Admitting: Family Medicine

## 2019-11-30 ENCOUNTER — Ambulatory Visit (INDEPENDENT_AMBULATORY_CARE_PROVIDER_SITE_OTHER): Payer: Medicaid Other | Admitting: Family Medicine

## 2019-11-30 VITALS — BP 118/72 | HR 84 | Temp 97.4°F | Ht 61.5 in | Wt 169.8 lb

## 2019-11-30 DIAGNOSIS — M79602 Pain in left arm: Secondary | ICD-10-CM | POA: Diagnosis not present

## 2019-11-30 DIAGNOSIS — S46812D Strain of other muscles, fascia and tendons at shoulder and upper arm level, left arm, subsequent encounter: Secondary | ICD-10-CM

## 2019-11-30 DIAGNOSIS — S46812A Strain of other muscles, fascia and tendons at shoulder and upper arm level, left arm, initial encounter: Secondary | ICD-10-CM | POA: Insufficient documentation

## 2019-11-30 NOTE — Patient Instructions (Signed)
Drop off FMLA paperwork to office for [place] to start the process. This may have a charge attached to it.  You must rest the muscle to allow it to heal. Muscle Strain A muscle strain is an injury that happens when a muscle is stretched longer than normal. This can happen during a fall, sports, or lifting. This can tear some muscle fibers. Usually, recovery from muscle strain takes 1-2 weeks. Complete healing normally takes 5-6 weeks. This condition is first treated with PRICE therapy. This involves:  Protecting your muscle from being injured again.  Resting your injured muscle.  Icing your injured muscle.  Applying pressure (compression) to your injured muscle. This may be done with a splint or elastic bandage.  Raising (elevating) your injured muscle. Your doctor may also recommend medicine for pain. Follow these instructions at home: If you have a splint:  Wear the splint as told by your doctor. Take it off only as told by your doctor.  Loosen the splint if your fingers or toes tingle, get numb, or turn cold and blue.  Keep the splint clean.  If the splint is not waterproof: ? Do not let it get wet. ? Cover it with a watertight covering when you take a bath or a shower. Managing pain, stiffness, and swelling   If directed, put ice on your injured area. ? If you have a removable splint, take it off as told by your doctor. ? Put ice in a plastic bag. ? Place a towel between your skin and the bag. ? Leave the ice on for 20 minutes, 2-3 times a day.  Move your fingers or toes often. This helps to avoid stiffness and lessen swelling.  Raise your injured area above the level of your heart while you are sitting or lying down.  Wear an elastic bandage as told by your doctor. Make sure it is not too tight. General instructions  Take over-the-counter and prescription medicines only as told by your doctor.  Limit your activity. Rest your injured muscle as told by your doctor. Your  doctor may say that gentle movements are okay.  If physical therapy was prescribed, do exercises as told by your doctor.  Do not put pressure on any part of the splint until it is fully hardened. This may take many hours.  Do not use any products that contain nicotine or tobacco, such as cigarettes and e-cigarettes. These can delay bone healing. If you need help quitting, ask your doctor.  Warm up before you exercise. This helps to prevent more muscle strains.  Ask your doctor when it is safe to drive if you have a splint.  Keep all follow-up visits as told by your doctor. This is important. Contact a doctor if:  You have more pain or swelling in your injured area. Get help right away if:  You have any of these problems in your injured area: ? You have numbness. ? You have tingling. ? You lose a lot of strength. Summary  A muscle strain is an injury that happens when a muscle is stretched longer than normal.  This condition is first treated with PRICE therapy. This includes protecting, resting, icing, adding pressure, and raising your injury.  Limit your activity. Rest your injured muscle as told by your doctor. Your doctor may say that gentle movements are okay.  Warm up before you exercise. This helps to prevent more muscle strains. This information is not intended to replace advice given to you by your health  care provider. Make sure you discuss any questions you have with your health care provider. Document Revised: 05/21/2018 Document Reviewed: 05/01/2016 Elsevier Patient Education  2020 [employer].

## 2019-11-30 NOTE — Progress Notes (Addendum)
   Patient ID: AVILA ALBRITTON, female    DOB: 1996-04-04, 24 y.o.   MRN: [id number]   Chief Complaint  Patient presents with  . Right shoulder pain  . Back Pain   Subjective:    HPI pain has been going on since Easter. Patient needs medical form for medical leave   Medical History Shadawn has a past medical history of ADHD (attention deficit hyperactivity disorder), Autistic spectrum disorder, MRSA (methicillin resistant Staphylococcus aureus), and Reflux.   Outpatient Encounter Medications as of 11/30/2019  Medication Sig  . atomoxetine (STRATTERA) 80 MG capsule TAKE (1) CAPSULE BY MOUTH ONCE DAILY.  . ciprofloxacin-dexamethasone (CIPRODEX) OTIC suspension Place 4 drops into the right ear 2 (two) times daily. (Patient not taking: Reported on 11/30/2019)  . fluticasone (FLONASE) 50 MCG/ACT nasal spray Place 1 spray into both nostrils daily for 14 days.  Marland Kitchen guanFACINE (INTUNIV) 1 MG TB24 ER tablet Take 1 tablet (1 mg total) by mouth 3 (three) times daily.  . methocarbamol (ROBAXIN) 500 MG tablet Take 1 tablet (500 mg total) by mouth 2 (two) times daily. (Patient not taking: Reported on 11/30/2019)  . naproxen (NAPROSYN) 500 MG tablet Take 1 tablet (500 mg total) by mouth 2 (two) times daily. (Patient not taking: Reported on 11/30/2019)   No facility-administered encounter medications on file as of 11/30/2019.     Review of Systems  Constitutional: Negative.   Musculoskeletal:       Left trapezius muscle strain diagnosed in April 2021.  Skin: Negative.      Vitals BP 118/72   Pulse 84   Temp (!) 97.4 F (36.3 C) (Oral)   Ht 5' 1.5" (1.562 m)   Wt 169 lb 12.8 oz (77 kg)   SpO2 97%   BMI 31.56 kg/m   Objective:   Physical Exam Constitutional:      General: She is not in acute distress. Musculoskeletal:        General: Signs of injury present.     Comments: Limited ROM left arm. Pain continues in the trapezius muscle due to lack of time to rest it properly.     Neurological:     Mental Status: She is alert. Mental status is at baseline.      Assessment and Plan   1. Strain of left trapezius muscle, subsequent encounter   Almira Coaster presents today wishing to initiate FMLA with her job, [employer] She was diagnosed on July 20, 2019 and has only been able to arrange 5 OT sessions due to scheduling issues.  [employer] promised to cross-train her, but this has not happened. She wishes to keep this job, as it took over one year to get the job.  She will drop off FMLA paperwork at this office for the process to begin. She understands there may be a charge attached to this service.  Almira Coaster is not in any distress today, but she wishes to take time away from work in order to properly rest the muscle so it can heal. Even a simple activity such as reaching for a can causes her much pain.   Agrees with plan of care discussed today. Understands warning signs to seek further care: fever, increased limitations in ROM.  Understands that she must rest the muscle so healing can occur and to continue with OT sessions. If after proper rest and therapy, if she continues to have pain, she will need a referral to ortho for further evaluation.   Dorena Bodo, FNP-C

## 2020-01-03 ENCOUNTER — Ambulatory Visit: Payer: Medicaid Other | Admitting: Orthopedic Surgery

## 2020-01-20 ENCOUNTER — Telehealth (HOSPITAL_COMMUNITY): Payer: Medicaid Other | Admitting: Psychiatry

## 2020-01-24 ENCOUNTER — Other Ambulatory Visit: Payer: Self-pay

## 2020-01-24 ENCOUNTER — Telehealth (INDEPENDENT_AMBULATORY_CARE_PROVIDER_SITE_OTHER): Payer: Medicaid Other | Admitting: Psychiatry

## 2020-01-24 ENCOUNTER — Encounter (HOSPITAL_COMMUNITY): Payer: Self-pay | Admitting: Psychiatry

## 2020-01-24 DIAGNOSIS — F900 Attention-deficit hyperactivity disorder, predominantly inattentive type: Secondary | ICD-10-CM | POA: Diagnosis not present

## 2020-01-24 DIAGNOSIS — F840 Autistic disorder: Secondary | ICD-10-CM | POA: Diagnosis not present

## 2020-01-24 MED ORDER — ATOMOXETINE HCL 80 MG PO CAPS: ORAL_CAPSULE | ORAL | 3 refills | Status: DC

## 2020-01-24 MED ORDER — GUANFACINE HCL ER 1 MG PO TB24: 1.0000 mg | ORAL_TABLET | Freq: Three times a day (TID) | ORAL | 3 refills | Status: DC

## 2020-01-24 NOTE — Progress Notes (Signed)
Virtual Visit via Video Note  I connected with Rebekah Haynes on 01/24/20 at  3:20 PM EDT by a video enabled telemedicine application and verified that I am speaking with the correct person using two identifiers.   I discussed the limitations of evaluation and management by telemedicine and the availability of in person appointments. The patient expressed understanding and agreed to proceed.     I discussed the assessment and treatment plan with the patient. The patient was provided an opportunity to ask questions and all were answered. The patient agreed with the plan and demonstrated an understanding of the instructions.   The patient was advised to call back or seek an in-person evaluation if the symptoms worsen or if the condition fails to improve as anticipated.  I provided 15 minutes of non-face-to-face time during this encounter. Location: Provider office, patient home  Diannia Ruder, MD  [facility] MD/PA/NP OP Progress Note  01/24/2020 3:36 PM Rebekah Haynes  MRN:  [id number]  Chief Complaint:  Chief Complaint    ADD; Anxiety; Follow-up     HPI: This patient is a 24 year old single white female who lives with her mother stepfather and  sister in [place]. She only sees her father very rarely. She works part-time in the [employer]. She is on disability for autistic spectrum disorder and ADHD. Her mother is currently her payee.  The patient and mother are self-referred. The patient went to the psychiatric and counseling center in [place] for many years and saw Dr. [name]. She was originally diagnosed with ADHD as a very young child and later in high school with autistic spectrum disorder. In the past she had been on medications to help with focus and impulsivity such as Strattera and Intuniv. She had a lapse in her Medicaid and is now back on it after 3 years and would like to return to using the medications.  The patient presents today with her mother. The  mother states that her pregnancy with this patient was normal and she did not use drugs alcohol or medications. The patient was born full-term without difficulty she was a difficult colicky baby who cried a lot and was hard to soothe. Her milestones in terms of walking and gross motor skills were normal but her speech was quite garbled and she really did not put words together until approximately age 19. She had speech therapy in elementary school as well as occupational therapy because of poor fine motor skills. According to mother she did not have cognitive delays. She was always a child to stay to her self and and did not interact much with other people. She is always had difficulty with transitions and did not like crowds or noises and does best when things are predictable and the same  The patient was tried on stimulants when she was young because she was very hyperactive over talkative and disruptive in class. The stimulants made her "too shut down" and she was switched to Strattera which she stayed out all the way through high school. She was also on intunivwhich seemed to help her impulsivity. She has been off these medications for 3 years due to insurance lapse. 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 Her goal is to eventually build computers but she does not seem to enthusiastic about  going back into college full-time. She spends most of her time playing video games watching Annamae. All of her friends are online. She does not use drugs alcohol cigarettes and is not sexually active. She denies depression or anxiety currently.  The mother states that she and her husband would like to see the patient become more independent. The patient has worked with vocational rehab and they have done some testing and  they helped her get her job. The patient has a driver's license but is too anxious and scared to drive. Her mother does all her money but she tends to spend too much on food. She has gained 14 pounds in the last year. Cognitively it seems that she could manage things on her own but she tends to make impulsive decisions. For now I suggested that we get her back on medication and see what her level of function is and also review her testing.  The patient will return after 3 months.  The patient states that she had injured her shoulder and therefore had to quit her job at the [employer].  She does not know what she is going to do next.  She does not seem to have any specific plans.  She states that she has a house guest visiting from [place] and the girl is "driving me crazy because she never stops talking."  Fortunately she is only staying a few more days.  Overall the patient states that her mood is good and her focus is pretty good and she is sleeping fairly well.  She does not seem to show much interest in going back to school. Visit Diagnosis:    ICD-10-CM   1. Attention deficit hyperactivity disorder (ADHD), predominantly inattentive type  F90.0   2. Autistic spectrum disorder  F84.0     Past Psychiatric History: The patient was treated by Dr. [name] in [place] for approximately 7 years with a diagnosis of ADHD and autism.  For most of this time she was on Strattera 80 mg daily and Intuniv 3 mg daily  Past Medical History:  Past Medical History:  Diagnosis Date   ADHD (attention deficit hyperactivity disorder)    Autistic spectrum disorder    MRSA (methicillin resistant Staphylococcus aureus)    Reflux     Past Surgical History:  Procedure Laterality Date   MASTOIDECTOMY      Family Psychiatric History: see below  Family History:  Family History  Problem Relation Age of Onset   Alcohol abuse Father    Drug abuse Father     Social History:  Social History    Socioeconomic History   Marital status: Single    Spouse name: Not on file   Number of children: Not on file   Years of education: Not on file   Highest education level: Not on file  Occupational History   Not on file  Tobacco Use   Smoking status: Never Smoker   Smokeless tobacco: Never Used  Vaping Use   Vaping Use: Never used  Substance and Sexual Activity   Alcohol use: Never   Drug use: Never   Sexual activity: Never  Other Topics Concern   Not on file  Social History Narrative   Not on file   Social Determinants of Health   Financial Resource Strain:    Difficulty of Paying Living Expenses: Not on file  Food Insecurity:    Worried About Running Out of Food in the Last Year: Not on file  Ran Out of Food in the Last Year: Not on file  Transportation Needs:    Lack of Transportation (Medical): Not on file   Lack of Transportation (Non-Medical): Not on file  Physical Activity:    Days of Exercise per Week: Not on file   Minutes of Exercise per Session: Not on file  Stress:    Feeling of Stress : Not on file  Social Connections:    Frequency of Communication with Friends and Family: Not on file   Frequency of Social Gatherings with Friends and Family: Not on file   Attends Religious Services: Not on file   Active Member of Clubs or Organizations: Not on file   Attends BankerClub or Organization Meetings: Not on file   Marital Status: Not on file    Allergies:  Allergies  Allergen Reactions   Clindamycin/Lincomycin     Metabolic Disorder Labs: No results found for: HGBA1C, MPG No results found for: PROLACTIN No results found for: CHOL, TRIG, HDL, CHOLHDL, VLDL, LDLCALC Lab Results  Component Value Date   TSH 1.660 01/21/2017    Therapeutic Level Labs: No results found for: LITHIUM No results found for: VALPROATE No components found for:  CBMZ  Current Medications: Current Outpatient Medications  Medication Sig Dispense  Refill   atomoxetine (STRATTERA) 80 MG capsule TAKE (1) CAPSULE BY MOUTH ONCE DAILY. 30 capsule 3   fluticasone (FLONASE) 50 MCG/ACT nasal spray Place 1 spray into both nostrils daily for 14 days. 16 g 0   guanFACINE (INTUNIV) 1 MG TB24 ER tablet Take 1 tablet (1 mg total) by mouth 3 (three) times daily. 90 tablet 3   No current facility-administered medications for this visit.     Musculoskeletal: Strength & Muscle Tone: within normal limits Gait & Station: normal Patient leans: N/A  Psychiatric Specialty Exam: Review of Systems  All other systems reviewed and are negative.   There were no vitals taken for this visit.There is no height or weight on file to calculate BMI.  General Appearance: Casual and Fairly Groomed  Eye Contact:  Good  Speech:  Clear and Coherent  Volume:  Normal  Mood:  Euthymic  Affect:  Appropriate and Congruent  Thought Process:  Goal Directed  Orientation:  Full (Time, Place, and Person)  Thought Content: WDL   Suicidal Thoughts:  No  Homicidal Thoughts:  No  Memory:  Immediate;   Good Recent;   Good Remote;   Good  Judgement:  Fair  Insight:  Shallow  Psychomotor Activity:  Normal  Concentration:  Concentration: Good and Attention Span: Good  Recall:  Good  Fund of Knowledge: Good  Language: Good  Akathisia:  No  Handed:  Right  AIMS (if indicated): not done  Assets:  Communication Skills Desire for Improvement Physical Health Resilience Social Support Talents/Skills  ADL's:  Intact  Cognition: WNL  Sleep:  Good   Screenings: PHQ2-9     [facility] Visit from 03/06/2017 in [facility]  PHQ-2 Total Score 0       Assessment and Plan:  This patient is a 24 year old female with a history of ADD and high functioning autistic disorder.  For the most part she continues to do well on her current regimen although she does not seem to be motivated towards work or school.  She will continue Strattera 80 mg daily as well as  Intuniv 1 mg 3 times daily both for ADD.  She will return to see me in 3 months  Diannia Rudereborah Demesha Boorman,  MD 01/24/2020, 3:36 PM

## 2020-04-25 ENCOUNTER — Telehealth (INDEPENDENT_AMBULATORY_CARE_PROVIDER_SITE_OTHER): Payer: Medicaid Other | Admitting: Psychiatry

## 2020-04-25 ENCOUNTER — Encounter (HOSPITAL_COMMUNITY): Payer: Self-pay | Admitting: Psychiatry

## 2020-04-25 ENCOUNTER — Other Ambulatory Visit: Payer: Self-pay

## 2020-04-25 DIAGNOSIS — F900 Attention-deficit hyperactivity disorder, predominantly inattentive type: Secondary | ICD-10-CM | POA: Diagnosis not present

## 2020-04-25 DIAGNOSIS — F840 Autistic disorder: Secondary | ICD-10-CM

## 2020-04-25 MED ORDER — GUANFACINE HCL ER 1 MG PO TB24: 1.0000 mg | ORAL_TABLET | Freq: Three times a day (TID) | ORAL | 3 refills | Status: DC

## 2020-04-25 MED ORDER — ATOMOXETINE HCL 80 MG PO CAPS: ORAL_CAPSULE | ORAL | 3 refills | Status: DC

## 2020-04-25 NOTE — Progress Notes (Signed)
Virtual Visit via Video Note  I connected with Rebekah Haynes on 04/25/20 at  3:20 PM EST by a video enabled telemedicine application and verified that I am speaking with the correct person using two identifiers.  Location: Patient: home Provider: home   I discussed the limitations of evaluation and management by telemedicine and the availability of in person appointments. The patient expressed understanding and agreed to proceed.    I discussed the assessment and treatment plan with the patient. The patient was provided an opportunity to ask questions and all were answered. The patient agreed with the plan and demonstrated an understanding of the instructions.   The patient was advised to call back or seek an in-person evaluation if the symptoms worsen or if the condition fails to improve as anticipated.  I provided 15 minutes of non-face-to-face time during this encounter.   Diannia Ruder, MD  [facility] MD/PA/NP OP Progress Note  04/25/2020 3:34 PM Rebekah Haynes  MRN:  [id number]  Chief Complaint:  Chief Complaint    Follow-up; ADD     HPI: This patient is a 25 year old single white female who lives with her mother stepfather and  sister in [place]. She only sees her father very rarely. 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 She is on disability for autistic spectrum disorder and ADHD. Her mother is currently her payee.  The patient and mother are self-referred. The patient went to the psychiatric and counseling center in [place] for many years and saw Dr. [name]. She was originally diagnosed with ADHD as a very young child and later in high school with autistic spectrum disorder. In the past she had been on medications to help with focus and impulsivity such as Strattera and Intuniv. She had a lapse in her Medicaid and is now back on it after 3 years and would like to return to using the medications.  The patient  presents today with her mother. The mother states that her pregnancy with this patient was normal and she did not use drugs alcohol or medications. The patient was born full-term without difficulty she was a difficult colicky baby who cried a lot and was hard to soothe. Her milestones in terms of walking and gross motor skills were normal but her speech was quite garbled and she really did not put words together until approximately age 55. She had speech therapy in elementary school as well as occupational therapy because of poor fine motor skills. According to mother she did not have cognitive delays. She was always a child to stay to her self and and did not interact much with other people. She is always had difficulty with transitions and did not like crowds or noises and does best when things are predictable and the same  The patient was tried on stimulants when she was young because she was very hyperactive over talkative and disruptive in class. The stimulants made her "too shut down" and she was switched to Strattera which she stayed out all the way through high school. She was also on intunivwhich seemed to help her impulsivity. She has been off these medications for 3 years due to insurance lapse. 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 Her goal is to eventually build computers but she does  not seem to enthusiastic about going back into college full-time. She spends most of her time playing video games watching Annamae. All of her friends are online. She does not use drugs alcohol cigarettes and is not sexually active. She denies depression or anxiety currently.  The mother states that she and her husband would like to see the patient become more independent. The patient has worked with vocational rehab  and they have done some testing and they helped her get her job. The patient has a driver's license but is too anxious and scared to drive. Her mother does all her money but she tends to spend too much on food. She has gained 14 pounds in the last year. Cognitively it seems that she could manage things on her own but she tends to make impulsive decisions. For now I suggested that we get her back on medication and see what her level of function is and also review her testing.  The patient returns for follow-up after 4 months.  For the most part she has been doing fairly well.  Last time she had quit her job at [employer] basically but now is helping care for her great-grandmother who has dementia.  She states that she likes it fairly well but her grandmother tends to repeat herself a lot.  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  She states that her mood is good and her focus is fairly good on her current medications.  They make her a bit drowsy but she does not want to change them.  She is sleeping well at night.  She denies any symptoms of depression or anxiety Visit Diagnosis:    ICD-10-CM   1. Attention deficit hyperactivity disorder (ADHD), predominantly inattentive type  F90.0   2. Autistic spectrum disorder  F84.0     Past Psychiatric History: The patient was treated by Dr. [name] in [place] for approximately 7 years with a diagnosis of ADHD and autism.  For most of this time she was on Strattera 80 mg daily and Intuniv 3 mg daily  Past Medical History:  Past Medical History:  Diagnosis Date  . ADHD (attention deficit hyperactivity disorder)   . Autistic spectrum disorder   . MRSA (methicillin resistant Staphylococcus aureus)   . Reflux     Past Surgical History:  Procedure Laterality Date  . MASTOIDECTOMY      Family Psychiatric History: see below  Family History:  Family History  Problem Relation Age of Onset  . Alcohol abuse Father    . Drug abuse Father     Social History:  Social History   Socioeconomic History  . Marital status: Single    Spouse name: Not on file  . Number of children: Not on file  . Years of education: Not on file  . Highest education level: Not on file  Occupational History  . Not on file  Tobacco Use  . Smoking status: Never Smoker  . Smokeless tobacco: Never Used  Vaping Use  . Vaping Use: Never used  Substance and Sexual Activity  . Alcohol use: Never  . Drug use: Never  . Sexual activity: Never  Other Topics Concern  . Not on file  Social History Narrative  . Not on file   Social Determinants of Health   Financial Resource Strain: Not on file  Food Insecurity: Not on file  Transportation Needs: Not on file  Physical Activity: Not on file  Stress: Not  on file  Social Connections: Not on file    Allergies:  Allergies  Allergen Reactions  . Clindamycin/Lincomycin     Metabolic Disorder Labs: No results found for: HGBA1C, MPG No results found for: PROLACTIN No results found for: CHOL, TRIG, HDL, CHOLHDL, VLDL, LDLCALC Lab Results  Component Value Date   TSH 1.660 01/21/2017    Therapeutic Level Labs: No results found for: LITHIUM No results found for: VALPROATE No components found for:  CBMZ  Current Medications: Current Outpatient Medications  Medication Sig Dispense Refill  . atomoxetine (STRATTERA) 80 MG capsule TAKE (1) CAPSULE BY MOUTH ONCE DAILY. 30 capsule 3  . fluticasone (FLONASE) 50 MCG/ACT nasal spray Place 1 spray into both nostrils daily for 14 days. 16 g 0  . guanFACINE (INTUNIV) 1 MG TB24 ER tablet Take 1 tablet (1 mg total) by mouth 3 (three) times daily. 90 tablet 3   No current facility-administered medications for this visit.     Musculoskeletal: Strength & Muscle Tone: within normal limits Gait & Station: normal Patient leans: N/A  Psychiatric Specialty Exam: Review of Systems  All other systems reviewed and are negative.    There were no vitals taken for this visit.There is no height or weight on file to calculate BMI.  General Appearance: Casual and Fairly Groomed  Eye Contact:  Good  Speech:  Clear and Coherent  Volume:  Normal  Mood:  Euthymic  Affect:  Congruent  Thought Process:  Goal Directed  Orientation:  Full (Time, Place, and Person)  Thought Content: WDL   Suicidal Thoughts:  No  Homicidal Thoughts:  No  Memory:  Immediate;   Good Recent;   Good Remote;   Fair  Judgement:  Fair  Insight:  Shallow  Psychomotor Activity:  Normal  Concentration:  Concentration: Good and Attention Span: Good  Recall:  Good  Fund of Knowledge: Good  Language: Good  Akathisia:  No  Handed:  Right  AIMS (if indicated): not done  Assets:  Communication Skills Desire for Improvement Physical Health Resilience Social Support Talents/Skills  ADL's:  Intact  Cognition: WNL  Sleep:  Good   Screenings: PHQ2-9   Flowsheet Row Office Visit from 03/06/2017 in [facility]  PHQ-2 Total Score 0       Assessment and Plan: This patient is a 25 year old female with a history of ADD and high functioning autistic disorder.  For the most part she continues to do well on her current regimen.  She will continue Strattera 80 mg daily as well as Intuniv 1 mg 3 times daily both for ADD.  She will return to see me in 4 months   Diannia Ruder, MD 04/25/2020, 3:34 PM

## 2020-08-22 ENCOUNTER — Telehealth (INDEPENDENT_AMBULATORY_CARE_PROVIDER_SITE_OTHER): Payer: Medicaid Other | Admitting: Psychiatry

## 2020-08-22 ENCOUNTER — Other Ambulatory Visit: Payer: Self-pay

## 2020-08-22 ENCOUNTER — Encounter (HOSPITAL_COMMUNITY): Payer: Self-pay | Admitting: Psychiatry

## 2020-08-22 DIAGNOSIS — F840 Autistic disorder: Secondary | ICD-10-CM | POA: Diagnosis not present

## 2020-08-22 DIAGNOSIS — F900 Attention-deficit hyperactivity disorder, predominantly inattentive type: Secondary | ICD-10-CM | POA: Diagnosis not present

## 2020-08-22 MED ORDER — GUANFACINE HCL ER 1 MG PO TB24: 1.0000 mg | ORAL_TABLET | Freq: Three times a day (TID) | ORAL | 3 refills | Status: DC

## 2020-08-22 MED ORDER — ATOMOXETINE HCL 80 MG PO CAPS: ORAL_CAPSULE | ORAL | 3 refills | Status: DC

## 2020-08-22 NOTE — Progress Notes (Signed)
Virtual Visit via Video Note  I connected with Rebekah Haynes on 08/22/20 at  1:20 PM EDT by a video enabled telemedicine application and verified that I am speaking with the correct person using two identifiers.  Location: Patient: home Provider: home office   I discussed the limitations of evaluation and management by telemedicine and the availability of in person appointments. The patient expressed understanding and agreed to proceed.    I discussed the assessment and treatment plan with the patient. The patient was provided an opportunity to ask questions and all were answered. The patient agreed with the plan and demonstrated an understanding of the instructions.   The patient was advised to call back or seek an in-person evaluation if the symptoms worsen or if the condition fails to improve as anticipated.  I provided 15 minutes of non-face-to-face time during this encounter.   Diannia Ruder, MD  [facility] MD/PA/NP OP Progress Note  08/22/2020 1:46 PM Rebekah Haynes  MRN:  [id number]  Chief Complaint:  Chief Complaint    ADD; Follow-up     HPI: This patient is a 25 year old single white female who lives with her mother stepfather and sister in [place]. She only sees her father very rarely. 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 She is on disability for autistic spectrum disorder and ADHD. Her mother is currently her payee.  The patient and mother are self-referred. The patient went to the psychiatric and counseling center in [place] for many years and saw Dr. [name]. She was originally diagnosed with ADHD as a very young child and later in high school with autistic spectrum disorder. In the past she had been on medications to help with focus and impulsivity such as Strattera and Intuniv. She had a lapse in her Medicaid and is now back on it after 3 years and would like to return to using the medications.  The patient  presents today with her mother. The mother states that her pregnancy with this patient was normal and she did not use drugs alcohol or medications. The patient was born full-term without difficulty she was a difficult colicky baby who cried a lot and was hard to soothe. Her milestones in terms of walking and gross motor skills were normal but her speech was quite garbled and she really did not put words together until approximately age 87. She had speech therapy in elementary school as well as occupational therapy because of poor fine motor skills. According to mother she did not have cognitive delays. She was always a child to stay to her self and and did not interact much with other people. She is always had difficulty with transitions and did not like crowds or noises and does best when things are predictable and the same  The patient was tried on stimulants when she was young because she was very hyperactive over talkative and disruptive in class. The stimulants made her "too shut down" and she was switched to Strattera which she stayed out all the way through high school. She was also on intunivwhich seemed to help her impulsivity. She has been off these medications for 3 years due to insurance lapse. 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 Her goal is to eventually build computers but she does  not seem to enthusiastic about going back into college full-time. She spends most of her time playing video games watching [employer]. All of her friends are online. She does not use drugs alcohol cigarettes and is not sexually active. She denies depression or anxiety currently.  The mother states that she and her husband would like to see the patient become more independent. The patient has worked with vocational rehab  and they have done some testing and they helped her get her job. The patient has a driver's license but is too anxious and scared to drive. Her mother does all her money but she tends to spend too much on food. She has gained 14 pounds in the last year. Cognitively it seems that she could manage things on her own but she tends to make impulsive decisions. For now I suggested that we get her back on medication and see what her level of function is and also review her testing.  Patient returns for follow-up after 4 months.  She states that in general she is doing fairly well.  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  She also helps take care of her great grandmother who has dementia.  She does some chores around the house.  She thinks her medications are still working well to help with her focus. Visit Diagnosis:    ICD-10-CM   1. Attention deficit hyperactivity disorder (ADHD), predominantly inattentive type  F90.0   2. Autistic spectrum disorder  F84.0     Past Psychiatric History: The patient was treated by Dr. [name] in [place] for approximately 7 years with a diagnosis of ADHD and autism. For most of this time she was on Strattera 80 mg daily and Intuniv 3 mg daily   Past Medical History:  Past Medical History:  Diagnosis Date  . ADHD (attention deficit hyperactivity disorder)   . Autistic spectrum disorder   . MRSA (methicillin resistant Staphylococcus aureus)   . Reflux     Past Surgical History:  Procedure Laterality Date  . MASTOIDECTOMY      Family Psychiatric History: see below  Family History:  Family History  Problem Relation Age of Onset  . Alcohol abuse Father   . Drug abuse Father     Social History:  Social History   Socioeconomic History  . Marital status: Single    Spouse name: Not on file  . Number of children: Not on file  . Years of education: Not on file  . Highest education level:  Not on file  Occupational History  . Not on file  Tobacco Use  . Smoking status: Never Smoker  . Smokeless tobacco: Never Used  Vaping Use  . Vaping Use: Never used  Substance and Sexual Activity  . Alcohol use: Never  . Drug use: Never  . Sexual activity: Never  Other Topics Concern  . Not on file  Social History Narrative  . Not on file   Social Determinants of Health   Financial Resource Strain: Not on file  Food Insecurity: Not on file  Transportation Needs: Not on file  Physical Activity: Not on file  Stress: Not on file  Social Connections: Not on file    Allergies:  Allergies  Allergen Reactions  . Clindamycin/Lincomycin     Metabolic Disorder Labs: No results found for: HGBA1C, MPG No results found for: PROLACTIN No results found for: CHOL, TRIG, HDL, CHOLHDL, VLDL, LDLCALC Lab  Results  Component Value Date   TSH 1.660 01/21/2017    Therapeutic Level Labs: No results found for: LITHIUM No results found for: VALPROATE No components found for:  CBMZ  Current Medications: Current Outpatient Medications  Medication Sig Dispense Refill  . atomoxetine (STRATTERA) 80 MG capsule TAKE (1) CAPSULE BY MOUTH ONCE DAILY. 30 capsule 3  . fluticasone (FLONASE) 50 MCG/ACT nasal spray Place 1 spray into both nostrils daily for 14 days. 16 g 0  . guanFACINE (INTUNIV) 1 MG TB24 ER tablet Take 1 tablet (1 mg total) by mouth 3 (three) times daily. 90 tablet 3   No current facility-administered medications for this visit.     Musculoskeletal: Strength & Muscle Tone: within normal limits Gait & Station: normal Patient leans: N/A  Psychiatric Specialty Exam: Review of Systems  All other systems reviewed and are negative.   There were no vitals taken for this visit.There is no height or weight on file to calculate BMI.  General Appearance: Casual and Disheveled  Eye Contact:  Good  Speech:  Clear and Coherent  Volume:  Normal  Mood:  Euthymic  Affect:   Congruent  Thought Process:  Goal Directed  Orientation:  Full (Time, Place, and Person)  Thought Content: WDL   Suicidal Thoughts:  no  Homicidal Thoughts:  No  Memory:  Immediate;   Good Recent;   Good Remote;   NA  Judgement:  Fair  Insight:  Shallow  Psychomotor Activity:  Normal  Concentration:  Concentration: Good and Attention Span: Good  Recall:  Good  Fund of Knowledge: Good  Language: Good  Akathisia:  No  Handed:  Right  AIMS (if indicated): not done  Assets:  Communication Skills Desire for Improvement Physical Health Resilience Social Support Talents/Skills  ADL's:  Intact  Cognition: WNL  Sleep:  Good   Screenings: PHQ2-9   Flowsheet Row Office Visit from 03/06/2017 in [facility]  PHQ-2 Total Score 0       Assessment and Plan: This patient is a 25 year old female with a history of ADD and high functioning autistic disorder.  She is focusing well on her current regimen.  She will continue Strattera 80 mg daily as well as Intuniv 1 mg 3 times daily both for ADD.  She will return to see me in 4 months   Diannia Ruder, MD 08/22/2020, 1:46 PM

## 2020-10-10 ENCOUNTER — Emergency Department (HOSPITAL_COMMUNITY): Payer: Medicaid Other

## 2020-10-10 ENCOUNTER — Emergency Department (HOSPITAL_COMMUNITY)
Admission: EM | Admit: 2020-10-10 | Discharge: 2020-10-10 | Disposition: A | Discharge status: Home / Self Care | Payer: Medicaid Other | Attending: Emergency Medicine | Admitting: Emergency Medicine

## 2020-10-10 ENCOUNTER — Other Ambulatory Visit: Payer: Self-pay

## 2020-10-10 ENCOUNTER — Encounter (HOSPITAL_COMMUNITY): Payer: Self-pay | Admitting: *Deleted

## 2020-10-10 DIAGNOSIS — F840 Autistic disorder: Secondary | ICD-10-CM | POA: Insufficient documentation

## 2020-10-10 DIAGNOSIS — R1011 Right upper quadrant pain: Secondary | ICD-10-CM | POA: Insufficient documentation

## 2020-10-10 DIAGNOSIS — R102 Pelvic and perineal pain: Secondary | ICD-10-CM | POA: Diagnosis not present

## 2020-10-10 DIAGNOSIS — R110 Nausea: Secondary | ICD-10-CM | POA: Diagnosis not present

## 2020-10-10 LAB — COMPREHENSIVE METABOLIC PANEL
ALT: 19 U/L (ref 0–44)
AST: 17 U/L (ref 15–41)
Albumin: 4.1 g/dL (ref 3.5–5.0)
Alkaline Phosphatase: 59 U/L (ref 38–126)
Anion gap: 8 (ref 5–15)
BUN: 10 mg/dL (ref 6–20)
CO2: 22 mmol/L (ref 22–32)
Calcium: 9.1 mg/dL (ref 8.9–10.3)
Chloride: 103 mmol/L (ref 98–111)
Creatinine, Ser: 0.59 mg/dL (ref 0.44–1.00)
GFR, Estimated: >60 mL/min (ref >60)
Glucose, Bld: 98 mg/dL (ref 70–99)
Potassium: 3.6 mmol/L (ref 3.5–5.1)
Sodium: 133 mmol/L — ABNORMAL LOW (ref 135–145)
Total Bilirubin: 0.3 mg/dL (ref 0.3–1.2)
Total Protein: 7.5 g/dL (ref 6.5–8.1)

## 2020-10-10 LAB — CBC WITH DIFFERENTIAL/PLATELET
Abs Immature Granulocytes: 0.12 10*3/uL — ABNORMAL HIGH (ref 0.00–0.07)
Basophils Absolute: 0.1 10*3/uL (ref 0.0–0.1)
Basophils Relative: 0 %
Eosinophils Absolute: 0 10*3/uL (ref 0.0–0.5)
Eosinophils Relative: 0 %
HCT: 39.9 % (ref 36.0–46.0)
Hemoglobin: 12.9 g/dL (ref 12.0–15.0)
Immature Granulocytes: 1 %
Lymphocytes Relative: 9 %
Lymphs Abs: 1.7 10*3/uL (ref 0.7–4.0)
MCH: 27.4 pg (ref 26.0–34.0)
MCHC: 32.3 g/dL (ref 30.0–36.0)
MCV: 84.7 fL (ref 80.0–100.0)
Monocytes Absolute: 1 10*3/uL (ref 0.1–1.0)
Monocytes Relative: 5 %
Neutro Abs: 15.9 10*3/uL — ABNORMAL HIGH (ref 1.7–7.7)
Neutrophils Relative %: 85 %
Platelets: 470 10*3/uL — ABNORMAL HIGH (ref 150–400)
RBC: 4.71 MIL/uL (ref 3.87–5.11)
RDW: 13.2 % (ref 11.5–15.5)
WBC: 18.8 10*3/uL — ABNORMAL HIGH (ref 4.0–10.5)
nRBC: 0 % (ref 0.0–0.2)

## 2020-10-10 LAB — URINALYSIS, ROUTINE W REFLEX MICROSCOPIC
Bilirubin Urine: NEGATIVE
Glucose, UA: NEGATIVE mg/dL
Ketones, ur: 20 mg/dL — AB
Leukocytes,Ua: NEGATIVE
Nitrite: NEGATIVE
Protein, ur: NEGATIVE mg/dL
Specific Gravity, Urine: 1.01 (ref 1.005–1.030)
pH: 6 (ref 5.0–8.0)

## 2020-10-10 LAB — HCG, SERUM, QUALITATIVE: Preg, Serum: NEGATIVE

## 2020-10-10 LAB — LIPASE, BLOOD: Lipase: 29 U/L (ref 11–51)

## 2020-10-10 MED ORDER — IOHEXOL 300 MG/ML  SOLN
100.0000 mL | Freq: Once | INTRAMUSCULAR | Status: AC | PRN
  Administered 2020-10-10: 100 mL via INTRAVENOUS

## 2020-10-10 MED ORDER — ONDANSETRON HCL 4 MG/2ML IJ SOLN
4.0000 mg | Freq: Once | INTRAMUSCULAR | Status: AC
  Administered 2020-10-10: 18:00:00 4 mg via INTRAVENOUS
  Filled 2020-10-10: qty 2

## 2020-10-10 MED ORDER — FAMOTIDINE IN NACL 20-0.9 MG/50ML-% IV SOLN
20.0000 mg | Freq: Once | INTRAVENOUS | Status: AC
  Administered 2020-10-10: 20 mg via INTRAVENOUS
  Filled 2020-10-10: qty 50

## 2020-10-10 MED ORDER — FAMOTIDINE 20 MG PO TABS: 20.0000 mg | ORAL_TABLET | Freq: Every day | ORAL | 0 refills | Status: DC

## 2020-10-10 MED ORDER — MORPHINE SULFATE (PF) 4 MG/ML IV SOLN
4.0000 mg | Freq: Once | INTRAVENOUS | Status: AC
  Administered 2020-10-10: 18:00:00 4 mg via INTRAVENOUS
  Filled 2020-10-10: qty 1

## 2020-10-10 MED ORDER — DICYCLOMINE HCL 20 MG PO TABS: 20.0000 mg | ORAL_TABLET | Freq: Two times a day (BID) | ORAL | 0 refills | Status: DC

## 2020-10-10 MED ORDER — ONDANSETRON 4 MG PO TBDP: 4.0000 mg | ORAL_TABLET | Freq: Three times a day (TID) | ORAL | 0 refills | Status: DC | PRN

## 2020-10-10 MED ORDER — SODIUM CHLORIDE 0.9 % IV SOLN: Freq: Once | INTRAVENOUS | Status: AC

## 2020-10-10 NOTE — ED Provider Notes (Signed)
[facility] EMERGENCY DEPARTMENT Provider Note   CSN: [id number] Arrival date & time: 10/10/20  1616     History Chief Complaint  Patient presents with   Abdominal Pain    Rebekah Haynes is a 25 y.o. female.  HPI Patient is a 25 year old female presenting today with upper abdominal pain that has been associated with nausea she states that her symptoms began this morning been persistent since.  She states she felt well yesterday had no symptoms.  She denies any diarrhea or vomiting.  She denies any chest pain shortness of breath coughing congestion lightheadedness or dizziness.  She denies any vaginal pain itching or discharge denies any rectal bleeding.  Denies any urinary frequency urgency or dysuria.  Patient states that she has a history of having occasional episode of abdominal pain but states that is usually quite mild she has never had pain like this before.  She denies any history of abdominal surgeries.      Past Medical History:  Diagnosis Date   ADHD (attention deficit hyperactivity disorder)    Autistic spectrum disorder    MRSA (methicillin resistant Staphylococcus aureus)    Reflux     Patient Active Problem List   Diagnosis Date Noted   Strain of left trapezius muscle 11/30/2019   Attention deficit hyperactivity disorder (ADHD) 04/30/2018   Autistic spectrum disorder 04/30/2018    Past Surgical History:  Procedure Laterality Date   MASTOIDECTOMY       OB History   No obstetric history on file.     Family History  Problem Relation Age of Onset   Alcohol abuse Father    Drug abuse Father     Social History   Tobacco Use   Smoking status: Never   Smokeless tobacco: Never  Vaping Use   Vaping Use: Never used  Substance Use Topics   Alcohol use: Never   Drug use: Never    Home Medications Prior to Admission medications   Medication Sig Start Date End Date Taking? Authorizing Provider  APPLE CIDER VINEGAR PO Take 1 tablet by mouth  daily.   Yes [provider]  atomoxetine (STRATTERA) 80 MG capsule TAKE (1) CAPSULE BY MOUTH ONCE DAILY. 08/22/20  Yes Myrlene Broker, MD  dicyclomine (BENTYL) 20 MG tablet Take 1 tablet (20 mg total) by mouth 2 (two) times daily. 10/10/20  Yes Pessy Delamar S, PA  famotidine (PEPCID) 20 MG tablet Take 1 tablet (20 mg total) by mouth at bedtime. 10/10/20  Yes Shavona Gunderman S, PA  guanFACINE (INTUNIV) 1 MG TB24 ER tablet Take 1 tablet (1 mg total) by mouth 3 (three) times daily. 08/22/20  Yes Myrlene Broker, MD  Multiple Vitamin (MULTIVITAMIN ADULT PO) Take 1 tablet by mouth daily.   Yes [provider]  ondansetron (ZOFRAN ODT) 4 MG disintegrating tablet Take 1 tablet (4 mg total) by mouth every 8 (eight) hours as needed for nausea or vomiting. 10/10/20  Yes Latisha Lasch S, PA  fluticasone (FLONASE) 50 MCG/ACT nasal spray Place 1 spray into both nostrils daily for 14 days. Patient not taking: Reported on 10/10/2020 09/06/19 09/20/19  Durward Parcel, FNP    Allergies    Clindamycin/lincomycin  Review of Systems   Review of Systems  Constitutional:  Negative for chills and fever.  HENT:  Negative for congestion.   Eyes:  Negative for pain.  Respiratory:  Negative for cough and shortness of breath.   Cardiovascular:  Negative for chest pain and leg  swelling.  Gastrointestinal:  Positive for abdominal pain and nausea. Negative for diarrhea and vomiting.  Genitourinary:  Negative for dysuria, flank pain, frequency and urgency.  Musculoskeletal:  Negative for myalgias.  Skin:  Negative for rash.  Neurological:  Negative for dizziness and headaches.   Physical Exam Updated Vital Signs BP 128/89   Pulse 88   Temp 98.2 F (36.8 C)   Resp 17   LMP  (LMP Unknown)   SpO2 99%   Physical Exam Vitals and nursing note reviewed.  Constitutional:      General: She is not in acute distress.    Appearance: She is obese.     Comments: Patient is 25 year old female pleasant,  demeanor normal.  Still appear to be in any acute distress but appears uncomfortable.  HENT:     Head: Normocephalic and atraumatic.     Nose: Nose normal.  Eyes:     General: No scleral icterus. Cardiovascular:     Rate and Rhythm: Normal rate and regular rhythm.     Pulses: Normal pulses.     Heart sounds: Normal heart sounds.  Pulmonary:     Effort: Pulmonary effort is normal. No respiratory distress.     Breath sounds: No wheezing.  Abdominal:     Palpations: Abdomen is soft.     Tenderness: There is abdominal tenderness. There is right CVA tenderness. There is no left CVA tenderness, guarding or rebound.     Comments: Tenderness to palpation right upper quadrant , negative Murphy sign  Musculoskeletal:     Cervical back: Normal range of motion.     Right lower leg: No edema.     Left lower leg: No edema.  Skin:    General: Skin is warm and dry.     Capillary Refill: Capillary refill takes less than 2 seconds.  Neurological:     Mental Status: She is alert. Mental status is at baseline.     Comments: Speech and mentation seems consistent with autism spectrum disorder which is her baseline.  She is alert and oriented x3, able answer questions appropriately  Moves all 4 extremities, sensation intact in all 4 extremities  Psychiatric:        Mood and Affect: Mood normal.        Behavior: Behavior normal.    ED Results / Procedures / Treatments   Labs (all labs ordered are listed, but only abnormal results are displayed) Labs Reviewed  CBC WITH DIFFERENTIAL/PLATELET - Abnormal; Notable for the following components:      Result Value   WBC 18.8 (*)    Platelets 470 (*)    Neutro Abs 15.9 (*)    Abs Immature Granulocytes 0.12 (*)    All other components within normal limits  COMPREHENSIVE METABOLIC PANEL - Abnormal; Notable for the following components:   Sodium 133 (*)    All other components within normal limits  URINALYSIS, ROUTINE W REFLEX MICROSCOPIC - Abnormal;  Notable for the following components:   Color, Urine STRAW (*)    Hgb urine dipstick SMALL (*)    Ketones, ur 20 (*)    Bacteria, UA RARE (*)    All other components within normal limits  LIPASE, BLOOD  HCG, SERUM, QUALITATIVE    EKG None  Radiology CT ABDOMEN PELVIS W CONTRAST  Result Date: 10/10/2020 CLINICAL DATA:  Acute non localized right upper quadrant abdominal pain and epigastric pain. EXAM: CT ABDOMEN AND PELVIS WITH CONTRAST TECHNIQUE: Multidetector CT imaging of the abdomen  and pelvis was performed using the standard protocol following bolus administration of intravenous contrast. CONTRAST:  OMNIPAQUE IOHEXOL 300 MG/ML  SOLN COMPARISON:  None. FINDINGS: Lower chest: Lung bases are clear. Hepatobiliary: No focal liver abnormality is seen. No gallstones, gallbladder wall thickening, or biliary dilatation. Pancreas: Unremarkable. No pancreatic ductal dilatation or surrounding inflammatory changes. Spleen: Normal in size without focal abnormality. Adrenals/Urinary Tract: Adrenal glands are unremarkable. Kidneys are normal, without renal calculi, focal lesion, or hydronephrosis. Bladder is unremarkable. Stomach/Bowel: Stomach is within normal limits. Appendix appears normal. No evidence of bowel wall thickening, distention, or inflammatory changes. Vascular/Lymphatic: No significant vascular findings are present. No enlarged abdominal or pelvic lymph nodes. Reproductive: Uterus and ovaries are not enlarged. Involuting cyst on the left ovary. Other: No abdominal wall hernia or abnormality. No abdominopelvic ascites. Musculoskeletal: No acute or significant osseous findings. IMPRESSION: No acute abnormalities demonstrated in the abdomen or pelvis. No bowel obstruction or inflammation. Involuting cyst on the left ovary. Electronically Signed   By: Burman Nieves M.D.   On: 10/10/2020 19:48    Procedures Procedures   Medications Ordered in ED Medications  0.9 %  sodium chloride  infusion ( Intravenous Stopped 10/10/20 2024)  morphine 4 MG/ML injection 4 mg (4 mg Intravenous Given 10/10/20 1758)  ondansetron (ZOFRAN) injection 4 mg (4 mg Intravenous Given 10/10/20 1758)  famotidine (PEPCID) IVPB 20 mg premix (0 mg Intravenous Stopped 10/10/20 2024)  iohexol (OMNIPAQUE) 300 MG/ML solution 100 mL (100 mLs Intravenous Contrast Given 10/10/20 1936)    ED Course  I have reviewed the triage vital signs and the nursing notes.  Pertinent labs & imaging results that were available during my care of the patient were reviewed by me and considered in my medical decision making (see chart for details).    MDM Rules/Calculators/A&P                          Patient is a 25 year old female overall well-appearing no history of abdominal surgeries does have some developmental delay but is mentating well alert and oriented x3 and is able answer questions reveal advanced.  Abdominal exam is notable for some right upper quadrant tenderness to palpation.  Concern for cholecystitis choledocholithiasis cholangitis.  Ultimately very low suspicion for the latter 2 given that she is mostly tender in the right upper quadrant.  She has had some nausea today but no vomiting.  No urinary symptoms.  CMP unremarkable hCG negative for pregnancy lipase within normal limits and urinalysis with rare bacteria she is having no urinary symptoms and I have low suspicion for urinary tract infection.  CBC with leukocytosis of 18.8.  On reassessment patient's is having no pain.  She is received Pepcid, morphine Zofran and 1 L of normal saline.  Will obtain CT imaging.  CT abdomen pelvis shows no abdominal pelvic abnormalities.  She does have a small involuting cyst in left ovary which I informed  She states she is having any pain here.  Notably she has no evidence of appendicitis or cholecystitis.  I had a lengthy discussion with her and mother who is at bedside about her symptoms.  If she has any return of her  abdominal pain or any nausea vomiting or fever she should return immediately to the ER.  I counseled them to monitor her symptoms.  Given that she is pain-free at this time and no longer tender in her abdomen will discharge home with Bentyl, Zofran and Pepcid.  They understand the importance of return precautions and understand the need for close follow-up with their primary care provider.  Final Clinical Impression(s) / ED Diagnoses Final diagnoses:  RUQ abdominal pain    Rx / DC Orders ED Discharge Orders          Ordered    dicyclomine (BENTYL) 20 MG tablet  2 times daily        10/10/20 2044    ondansetron (ZOFRAN ODT) 4 MG disintegrating tablet  Every 8 hours PRN        10/10/20 2044    famotidine (PEPCID) 20 MG tablet  Daily at bedtime        10/10/20 2044             Gailen Shelter, [place] 10/10/20 2227    Eber Hong, MD 10/12/20 [phone]

## 2020-10-10 NOTE — ED Triage Notes (Signed)
Abdominal pain onset this am, denies vomiting and does have nausea

## 2020-10-10 NOTE — Discharge Instructions (Addendum)
Your work-up today was reassuring.  Your CT scan did not show any acute infection.  I would be very cognizant of your symptoms and continue to monitor them.  Please closely follow-up with your primary care provider in the next 24-48 hours for reevaluation of your abdomen.  You may always return to the ER for any new or concerning symptoms.  Please take medications as prescribed I have prescribed you Zofran as needed for nausea.  I also need to take daily Pepcid and I have also prescribed Bentyl for abdominal pain.

## 2020-10-11 ENCOUNTER — Encounter: Payer: Self-pay | Admitting: Family Medicine

## 2020-10-11 ENCOUNTER — Ambulatory Visit (INDEPENDENT_AMBULATORY_CARE_PROVIDER_SITE_OTHER): Payer: Medicaid Other | Admitting: Family Medicine

## 2020-10-11 ENCOUNTER — Other Ambulatory Visit: Payer: Self-pay

## 2020-10-11 VITALS — BP 128/85 | HR 97 | Temp 98.4°F | Ht 61.5 in | Wt 181.0 lb

## 2020-10-11 DIAGNOSIS — R1011 Right upper quadrant pain: Secondary | ICD-10-CM

## 2020-10-11 NOTE — Progress Notes (Signed)
Patient ID: Rebekah Haynes, female    DOB: 12/22/1995, 25 y.o.   MRN: [id number]   Chief Complaint  Patient presents with   Abdominal Pain    Went to ER yesterday was prescribed bentyl, zofran, pepcid   Subjective:    HPI  Cc- abd pain.  Woke yesterday morning in pain under rt breast.   Went to ER for the pain and had image of abd and less pain today.  Pain at ER was 5/10, today is about 1-2/10.  They dropped off meds to pharmacy today and need to pick up scripts.  Has not taken the meds from ER yet. Rt upper under the breast and and some generalized pain. Normal bm. Last one- yesterday. Solid stools, no blood. No fever.  No vomiting.  +nausea. Having regular periods- Last LMP- in June and unknown date. Urine preg is negative. Not eating yesterday and ate lunch.  4th of July. Playing corn hole on Monday. No spicy foods or caffeine. Usually drinking a lot of caffeine. Not had much last 2 days.   Medical History Ruqaya has a past medical history of ADHD (attention deficit hyperactivity disorder), Autistic spectrum disorder, MRSA (methicillin resistant Staphylococcus aureus), and Reflux.   Outpatient Encounter Medications as of 10/11/2020  Medication Sig   APPLE CIDER VINEGAR PO Take 1 tablet by mouth daily.   atomoxetine (STRATTERA) 80 MG capsule TAKE (1) CAPSULE BY MOUTH ONCE DAILY.   dicyclomine (BENTYL) 20 MG tablet Take 1 tablet (20 mg total) by mouth 2 (two) times daily.   famotidine (PEPCID) 20 MG tablet Take 1 tablet (20 mg total) by mouth at bedtime.   fluticasone (FLONASE) 50 MCG/ACT nasal spray Place 1 spray into both nostrils daily for 14 days. (Patient not taking: Reported on 10/10/2020)   guanFACINE (INTUNIV) 1 MG TB24 ER tablet Take 1 tablet (1 mg total) by mouth 3 (three) times daily.   Multiple Vitamin (MULTIVITAMIN ADULT PO) Take 1 tablet by mouth daily.   ondansetron (ZOFRAN ODT) 4 MG disintegrating tablet Take 1 tablet (4 mg total) by mouth every 8  (eight) hours as needed for nausea or vomiting.   No facility-administered encounter medications on file as of 10/11/2020.     Review of Systems  Constitutional:  Negative for chills and fever.  HENT:  Negative for congestion, rhinorrhea and sore throat.   Respiratory:  Negative for cough, shortness of breath and wheezing.   Cardiovascular:  Negative for chest pain and leg swelling.  Gastrointestinal:  Positive for abdominal pain and nausea. Negative for diarrhea and vomiting.  Genitourinary:  Negative for dysuria and frequency.  Musculoskeletal:  Negative for arthralgias and back pain.  Skin:  Negative for rash.  Neurological:  Negative for dizziness, weakness and headaches.    Vitals BP 128/85   Pulse 97   Temp 98.4 F (36.9 C)   Ht 5' 1.5" (1.562 m)   Wt 181 lb (82.1 kg)   LMP  (LMP Unknown)   SpO2 95%   BMI 33.65 kg/m   Objective:   Physical Exam Vitals and nursing note reviewed.  Constitutional:      Appearance: Normal appearance.  HENT:     Head: Normocephalic and atraumatic.     Nose: Nose normal.     Mouth/Throat:     Mouth: Mucous membranes are moist.     Pharynx: Oropharynx is clear.  Eyes:     Extraocular Movements: Extraocular movements intact.     Conjunctiva/sclera: Conjunctivae normal.  Pupils: Pupils are equal, round, and reactive to light.  Cardiovascular:     Rate and Rhythm: Normal rate and regular rhythm.     Pulses: Normal pulses.     Heart sounds: Normal heart sounds.  Pulmonary:     Effort: Pulmonary effort is normal.     Breath sounds: Normal breath sounds. No wheezing, rhonchi or rales.  Abdominal:     General: Abdomen is flat.     Palpations: Abdomen is soft.     Tenderness: There is no abdominal tenderness. There is no guarding or rebound. Negative signs include Murphy's sign and McBurney's sign.     Hernia: No hernia is present.  Musculoskeletal:        General: Normal range of motion.     Right lower leg: No edema.     Left  lower leg: No edema.  Skin:    General: Skin is warm and dry.     Findings: No lesion or rash.  Neurological:     General: No focal deficit present.     Mental Status: She is alert and oriented to person, place, and time.  Psychiatric:        Mood and Affect: Mood normal.        Behavior: Behavior normal.     Assessment and Plan   1. Abdominal pain, RUQ    Reviewed image and ct scan no sign of gallbladder disease or inflammation. Otherwise unremarkable, no other acute findings for infection or abd process. Labs showing- leukocytosis at 18. With neut.  Not having any other sign of infection in body.  Advising to take pepcid daily, zofran prn and bentyl prn. Bland diet next week and f/u pcp in 7 days.  Avoiding caffeine, nsaids, and spicy foods. Call or rto if worsening pain.  Return in about 1 week (around 10/18/2020) for f/u abd pain with Dr. Lorin Picket.   10/11/2020

## 2020-10-19 ENCOUNTER — Other Ambulatory Visit: Payer: Self-pay

## 2020-10-19 ENCOUNTER — Ambulatory Visit (INDEPENDENT_AMBULATORY_CARE_PROVIDER_SITE_OTHER): Payer: Medicaid Other | Admitting: Family Medicine

## 2020-10-19 VITALS — BP 117/79 | HR 73 | Temp 98.4°F | Wt 179.0 lb

## 2020-10-19 DIAGNOSIS — D72829 Elevated white blood cell count, unspecified: Secondary | ICD-10-CM | POA: Diagnosis not present

## 2020-10-19 DIAGNOSIS — R1010 Upper abdominal pain, unspecified: Secondary | ICD-10-CM

## 2020-10-19 DIAGNOSIS — Z79899 Other long term (current) drug therapy: Secondary | ICD-10-CM

## 2020-10-19 MED ORDER — FAMOTIDINE 40 MG PO TABS: 20.0000 mg | ORAL_TABLET | Freq: Every day | ORAL | 4 refills | Status: DC

## 2020-10-19 NOTE — Progress Notes (Signed)
   Subjective:    Patient ID: Rebekah Haynes, female    DOB: 1995/04/15, 25 y.o.   MRN: [id number]  HPIfollow up on abdominal pain. Still having some pain when bending over or when laughing.  Pain in upper mid area. No nausea in the past couple of day. Taking bentyl, pepcid  ER notes reviewed in detail Patient overall doing relatively well Some epigastric pain and discomfort Denies vomiting or diarrhea or bloody stools.  Review of Systems     Objective:   Physical Exam  General-in no acute distress Eyes-no discharge Lungs-respiratory rate normal, CTA CV-no murmurs,RRR Extremities skin warm dry no edema Neuro grossly normal Behavior normal, alert Mild abdominal tenderness in epigastric no right upper quadrant tenderness      Assessment & Plan:  Probable gastritis Increase famotidine new dose 40 mg Healthy diet recommended Follow-up within 3 months sooner if any problems If not doing dramatically better in the next 2 weeks notify us Hold off on any additional testing currently If frequent reoccurrence neck step would be ultrasound lab work and GI consultation  Patient did have leukocytosis we will recheck CBC to make sure that it has gone down

## 2020-10-19 NOTE — Patient Instructions (Signed)
Repeat lab test  Stop dicyclomine  Increase famotidine to 40 mg one daily   Food Choices for Gastroesophageal Reflux Disease, Adult When you have gastroesophageal reflux disease (GERD), the foods you eat and your eating habits are very important. Choosing the right foods can help ease your discomfort. Think about working with a food expert (dietitian) to help you make good choices. What are tips for following this plan? Reading food labels Look for foods that are low in saturated fat. Foods that may help with your symptoms include: Foods that have less than 5% of daily value (DV) of fat. Foods that have 0 grams of trans fat. Cooking Do not fry your food. Cook your food by baking, steaming, grilling, or broiling. These are all methods that do not need a lot of fat for cooking. To add flavor, try to use herbs that are low in spice and acidity. Meal planning  Choose healthy foods that are low in fat, such as: Fruits and vegetables. Whole grains. Low-fat dairy products. Lean meats, fish, and poultry. Eat small meals often instead of eating 3 large meals each day. Eat your meals slowly in a place where you are relaxed. Avoid bending over or lying down until 2-3 hours after eating. Limit high-fat foods such as fatty meats or fried foods. Limit your intake of fatty foods, such as oils, butter, and shortening. Avoid the following as told by your doctor: Foods that cause symptoms. These may be different for different people. Keep a food diary to keep track of foods that cause symptoms. Alcohol. Drinking a lot of liquid with meals. Eating meals during the 2-3 hours before bed.  Lifestyle Stay at a healthy weight. Ask your doctor what weight is healthy for you. If you need to lose weight, work with your doctor to do so safely. Exercise for at least 30 minutes on 5 or more days each week, or as told by your doctor. Wear loose-fitting clothes. Do not smoke or use any products that contain  nicotine or tobacco. If you need help quitting, ask your doctor. Sleep with the head of your bed higher than your feet. Use a wedge under the mattress or blocks under the bed frame to raise the head of the bed. Chew sugar-free gum after meals. What foods should eat?  Eat a healthy, well-balanced diet of fruits, vegetables, whole grains, low-fatdairy products, lean meats, fish, and poultry. Each person is different. Foods that may cause symptoms in one person may not cause any symptoms inanother person. Work with your doctor to find foods that are safe for you. The items listed above may not be a complete list of what you can eat and drink. Contact a food expert for more options. What foods should I avoid? Limiting some of these foods may help in managing the symptoms of GERD. Everyone is different. Talk with a food expert or your doctor to help you findthe exact foods to avoid, if any. Fruits Any fruits prepared with added fat. Any fruits that cause symptoms. For some people, this may include citrus fruits, such as oranges, grapefruit, pineapple,and lemons. Vegetables Deep-fried vegetables. [place] fries. Any vegetables prepared with added fat. Any vegetables that cause symptoms. For some people, this may include tomatoesand tomato products, chili peppers, onions and garlic, and horseradish. Grains Pastries or quick breads with added fat. Meats and other proteins High-fat meats, such as fatty beef or pork, hot dogs, ribs, ham, sausage, salami, and bacon. Fried meat or protein, including fried fish  and friedchicken. Nuts and nut butters, in large amounts. Dairy Whole milk and chocolate milk. Sour cream. Cream. Ice cream. Cream cheese.Milkshakes. Fats and oils Butter. Margarine. Shortening. Ghee. Beverages Coffee and tea, with or without caffeine. Carbonated beverages. Sodas. Energy drinks. Fruit juice made with acidic fruits, such as orange or grapefruit.Tomato juice. Alcoholic drinks. Sweets  and desserts Chocolate and cocoa. Donuts. Seasonings and condiments Pepper. Peppermint and spearmint. Added salt. Any condiments, herbs, or seasonings that cause symptoms. For some people, this may include curry, hotsauce, or vinegar-based salad dressings. The items listed above may not be a complete list of what you should not eat and drink. Contact a food expert for more options. Questions to ask your doctor Diet and lifestyle changes are often the first steps that are taken to manage symptoms of GERD. If diet and lifestyle changes do not help, talk with yourdoctor about taking medicines. Where to find more information International [employer] for Gastrointestinal Disorders: aboutgerd.org Summary When you have GERD, food and lifestyle choices are very important in easing your symptoms. Eat small meals often instead of 3 large meals a day. Eat your meals slowly and in a place where you are relaxed. Avoid bending over or lying down until 2-3 hours after eating. Limit high-fat foods such as fatty meats or fried foods. This information is not intended to replace advice given to you by your health care provider. Make sure you discuss any questions you have with your healthcare provider. Document Revised: 10/04/2019 Document Reviewed: 10/04/2019 Elsevier Patient Education  2022 [employer].

## 2020-10-20 LAB — CBC WITH DIFFERENTIAL/PLATELET
Basophils Absolute: 0.1 10*3/uL (ref 0.0–0.2)
Basos: 1 %
EOS (ABSOLUTE): 0.3 10*3/uL (ref 0.0–0.4)
Eos: 4 %
Hematocrit: 39.2 % (ref 34.0–46.6)
Hemoglobin: 12.3 g/dL (ref 11.1–15.9)
Immature Grans (Abs): 0 10*3/uL (ref 0.0–0.1)
Immature Granulocytes: 0 %
Lymphocytes Absolute: 1.9 10*3/uL (ref 0.7–3.1)
Lymphs: 25 %
MCH: 26.6 pg (ref 26.6–33.0)
MCHC: 31.4 g/dL — ABNORMAL LOW (ref 31.5–35.7)
MCV: 85 fL (ref 79–97)
Monocytes Absolute: 0.5 10*3/uL (ref 0.1–0.9)
Monocytes: 7 %
Neutrophils Absolute: 4.8 10*3/uL (ref 1.4–7.0)
Neutrophils: 63 %
Platelets: 478 10*3/uL — ABNORMAL HIGH (ref 150–450)
RBC: 4.62 x10E6/uL (ref 3.77–5.28)
RDW: 13 % (ref 11.7–15.4)
WBC: 7.6 10*3/uL (ref 3.4–10.8)

## 2020-10-20 LAB — HEPATIC FUNCTION PANEL
ALT: 23 IU/L (ref 0–32)
AST: 17 IU/L (ref 0–40)
Albumin: 4.6 g/dL (ref 3.9–5.0)
Alkaline Phosphatase: 70 IU/L (ref 44–121)
Bilirubin Total: 0.2 mg/dL (ref 0.0–1.2)
Bilirubin, Direct: <0.1 mg/dL (ref 0.00–0.40)
Total Protein: 7.2 g/dL (ref 6.0–8.5)

## 2020-10-20 LAB — LIPASE: Lipase: 27 U/L (ref 14–72)

## 2020-10-20 LAB — BASIC METABOLIC PANEL
BUN/Creatinine Ratio: 10 (ref 9–23)
BUN: 7 mg/dL (ref 6–20)
CO2: 23 mmol/L (ref 20–29)
Calcium: 9.4 mg/dL (ref 8.7–10.2)
Chloride: 101 mmol/L (ref 96–106)
Creatinine, Ser: 0.72 mg/dL (ref 0.57–1.00)
Glucose: 93 mg/dL (ref 65–99)
Potassium: 4.7 mmol/L (ref 3.5–5.2)
Sodium: 139 mmol/L (ref 134–144)
eGFR: 120 mL/min/{1.73_m2} (ref >59)

## 2020-12-20 ENCOUNTER — Ambulatory Visit: Payer: Medicaid Other | Admitting: Family Medicine

## 2021-01-15 ENCOUNTER — Ambulatory Visit
Admission: EM | Admit: 2021-01-15 | Discharge: 2021-01-15 | Disposition: A | Discharge status: Home / Self Care | Payer: Medicaid Other | Attending: Urgent Care | Admitting: Urgent Care

## 2021-01-15 ENCOUNTER — Other Ambulatory Visit: Payer: Self-pay

## 2021-01-15 DIAGNOSIS — H65194 Other acute nonsuppurative otitis media, recurrent, right ear: Secondary | ICD-10-CM | POA: Diagnosis not present

## 2021-01-15 MED ORDER — AMOXICILLIN 875 MG PO TABS: 875.0000 mg | ORAL_TABLET | Freq: Two times a day (BID) | ORAL | 0 refills | Status: DC

## 2021-01-15 NOTE — ED Triage Notes (Signed)
Pt has had congestion/coughing "cold" s/s for a few days then developed R ear pain starting two days ago.  Pt reports feeling of pressure in R ear.  No fevers.  No home COVID.  Pt took Aleve PTA with some relief. Primary concern is ear pain.

## 2021-01-15 NOTE — ED Provider Notes (Signed)
[facility]   MRN: [id number] DOB: 07-Jun-1995  Subjective:   Rebekah Haynes is a 25 y.o. female presenting for 2-day history of acute onset right ear pain with drainage and slight bleeding.  Feels fullness as well.  Has a history of ear infections, history of ear tubes.  She did have cold symptoms that resolved.  Has been using over-the-counter Aleve with some relief.  No current facility-administered medications for this encounter.  Current Outpatient Medications:    atomoxetine (STRATTERA) 80 MG capsule, TAKE (1) CAPSULE BY MOUTH ONCE DAILY., Disp: 30 capsule, Rfl: 3   guanFACINE (INTUNIV) 1 MG TB24 ER tablet, Take 1 tablet (1 mg total) by mouth 3 (three) times daily., Disp: 90 tablet, Rfl: 3   famotidine (PEPCID) 40 MG tablet, Take 0.5 tablets (20 mg total) by mouth at bedtime., Disp: 30 tablet, Rfl: 4   ondansetron (ZOFRAN ODT) 4 MG disintegrating tablet, Take 1 tablet (4 mg total) by mouth every 8 (eight) hours as needed for nausea or vomiting., Disp: 20 tablet, Rfl: 0   Allergies  Allergen Reactions   Clindamycin/Lincomycin     Past Medical History:  Diagnosis Date   ADHD (attention deficit hyperactivity disorder)    Autistic spectrum disorder    MRSA (methicillin resistant Staphylococcus aureus)    Reflux      Past Surgical History:  Procedure Laterality Date   MASTOIDECTOMY      Family History  Problem Relation Age of Onset   Healthy Mother    Alcohol abuse Father    Drug abuse Father     Social History   Tobacco Use   Smoking status: Never   Smokeless tobacco: Never  Vaping Use   Vaping Use: Never used  Substance Use Topics   Alcohol use: Never   Drug use: Never    ROS   Objective:   Vitals: BP 123/76 (BP Location: Right Arm)   Pulse 97   Temp 97.9 F (36.6 C) (Oral)   Resp 16   LMP 01/06/2021   SpO2 96%   Physical Exam Constitutional:      General: She is not in acute distress.    Appearance: Normal appearance. She  is well-developed. She is not ill-appearing, toxic-appearing or diaphoretic.  HENT:     Head: Normocephalic and atraumatic.     Right Ear: External ear normal. No drainage or tenderness. No middle ear effusion. There is no impacted cerumen. Tympanic membrane is not erythematous.     Left Ear: Tympanic membrane, ear canal and external ear normal. No drainage or tenderness.  No middle ear effusion. There is no impacted cerumen. Tympanic membrane is not erythematous.     Ears:     Comments: TM bulging, erythematous and inflamed.    Nose: Congestion and rhinorrhea present.     Mouth/Throat:     Mouth: Mucous membranes are moist. No oral lesions.     Pharynx: No pharyngeal swelling, oropharyngeal exudate, posterior oropharyngeal erythema or uvula swelling.     Tonsils: No tonsillar exudate or tonsillar abscesses.  Eyes:     General: No scleral icterus.    Extraocular Movements: Extraocular movements intact.     Right eye: Normal extraocular motion.     Left eye: Normal extraocular motion.     Conjunctiva/sclera: Conjunctivae normal.     Pupils: Pupils are equal, round, and reactive to light.  Cardiovascular:     Rate and Rhythm: Normal rate.  Pulmonary:     Effort: Pulmonary effort is  normal.  Musculoskeletal:     Cervical back: Normal range of motion and neck supple.  Lymphadenopathy:     Cervical: No cervical adenopathy.  Skin:    General: Skin is warm and dry.  Neurological:     General: No focal deficit present.     Mental Status: She is alert and oriented to person, place, and time.  Psychiatric:        Mood and Affect: Mood normal.        Behavior: Behavior normal.      Assessment and Plan :   PDMP not reviewed this encounter.  1. Other recurrent acute nonsuppurative otitis media of right ear     Start amoxicillin to cover for otitis media. Use supportive care otherwise. Counseled patient on potential for adverse effects with medications prescribed/recommended today, ER  and return-to-clinic precautions discussed, patient verbalized understanding.    Wallis Bamberg, [place] 01/15/21 [phone]

## 2021-02-02 ENCOUNTER — Other Ambulatory Visit: Payer: Self-pay

## 2021-02-02 ENCOUNTER — Ambulatory Visit (INDEPENDENT_AMBULATORY_CARE_PROVIDER_SITE_OTHER): Payer: Medicaid Other | Admitting: Nurse Practitioner

## 2021-02-02 ENCOUNTER — Encounter: Payer: Self-pay | Admitting: Nurse Practitioner

## 2021-02-02 VITALS — BP 123/83 | HR 94 | Temp 97.7°F | Ht 61.5 in | Wt 181.0 lb

## 2021-02-02 DIAGNOSIS — H66011 Acute suppurative otitis media with spontaneous rupture of ear drum, right ear: Secondary | ICD-10-CM | POA: Diagnosis not present

## 2021-02-02 MED ORDER — OFLOXACIN 0.3 % OT SOLN: 10.0000 [drp] | Freq: Every day | OTIC | 0 refills | Status: DC

## 2021-02-02 NOTE — Progress Notes (Signed)
   Subjective:    Patient ID: Rebekah Haynes, female    DOB: [birth year], 25 y.o.   MRN: [id number]  HPI  Patient is here today for follow up on R ear pain UC visit on 01/15/21 completed round of abx  Grandmother is present today.  Pain is slightly better, now comes and goes.  No fever.  Has had slight greenish drainage at times.  No odor.  Analgesics and her cold medicines have helped some.  Head congestion is better.  No sore throat or cough.  Completed her course of amoxicillin prescribed by urgent care.  No cough.       Objective:   Physical Exam NAD.  Alert, oriented.  Left TM mildly retracted, normal color, no erythema.  Right TM no erythema but what appears to be a tiny area around 11:00 of a small perforation which is healing.  Small amount of clear drainage is noted in the ear canal.  No color or odor.  Mild tenderness with movement of the pinna and tragus.  Pharynx clear.  Neck supple with mild soft anterior adenopathy, slightly tender near the ear area.  Lungs clear.  Heart regular rate rhythm.  Today's Vitals   02/02/21 1026  BP: 123/83  Pulse: 94  Temp: 97.7 F (36.5 C)  SpO2: 97%  Weight: 181 lb (82.1 kg)  Height: 5' 1.5" (1.562 m)   Body mass index is 33.65 kg/m.        Assessment & Plan:  Non-recurrent acute suppurative otitis media of right ear with spontaneous rupture of tympanic membrane Meds ordered this encounter  Medications   ofloxacin (FLOXIN) 0.3 % OTIC solution    Sig: Place 10 drops into the left ear daily. X 7 days    Dispense:  5 mL    Refill:  0    Order Specific Question:   Supervising Provider    Answer:   Lilyan Punt A [[id number]]   Expect continued gradual resolution of symptoms.  Call back in 10 to 14 days if symptoms persist, sooner if worse.  Warning signs reviewed with patient and her grandmother.

## 2021-04-10 ENCOUNTER — Ambulatory Visit
Admission: EM | Admit: 2021-04-10 | Discharge: 2021-04-10 | Disposition: A | Discharge status: Home / Self Care | Payer: Medicaid Other | Attending: Urgent Care | Admitting: Urgent Care

## 2021-04-10 ENCOUNTER — Other Ambulatory Visit: Payer: Self-pay

## 2021-04-10 DIAGNOSIS — H6981 Other specified disorders of Eustachian tube, right ear: Secondary | ICD-10-CM | POA: Diagnosis not present

## 2021-04-10 DIAGNOSIS — H9201 Otalgia, right ear: Secondary | ICD-10-CM

## 2021-04-10 MED ORDER — LEVOCETIRIZINE DIHYDROCHLORIDE 5 MG PO TABS: 5.0000 mg | ORAL_TABLET | Freq: Every evening | ORAL | 0 refills | Status: DC

## 2021-04-10 MED ORDER — FLUTICASONE PROPIONATE 50 MCG/ACT NA SUSP: 2.0000 | Freq: Every day | NASAL | 12 refills | Status: DC

## 2021-04-10 MED ORDER — PSEUDOEPHEDRINE HCL 30 MG PO TABS: 30.0000 mg | ORAL_TABLET | Freq: Three times a day (TID) | ORAL | 0 refills | Status: DC | PRN

## 2021-04-10 NOTE — ED Provider Notes (Signed)
[facility]   MRN: [id number] DOB: 1995-08-15  Subjective:   Rebekah Haynes is a 26 y.o. female presenting for 2-day history of recurrent right ear pain.  No fever, dizziness, drainage of pus or bleeding, runny or stuffy nose, sore throat, cough.  Patient does have a history of ear infections, has a tympanostomy.  Does not take any allergy medications.  No current facility-administered medications for this encounter.  Current Outpatient Medications:    atomoxetine (STRATTERA) 80 MG capsule, TAKE (1) CAPSULE BY MOUTH ONCE DAILY., Disp: 30 capsule, Rfl: 3   guanFACINE (INTUNIV) 1 MG TB24 ER tablet, Take 1 tablet (1 mg total) by mouth 3 (three) times daily., Disp: 90 tablet, Rfl: 3   ofloxacin (FLOXIN) 0.3 % OTIC solution, Place 10 drops into the left ear daily. X 7 days, Disp: 5 mL, Rfl: 0   Allergies  Allergen Reactions   Clindamycin/Lincomycin     Past Medical History:  Diagnosis Date   ADHD (attention deficit hyperactivity disorder)    Autistic spectrum disorder    MRSA (methicillin resistant Staphylococcus aureus)    Reflux      Past Surgical History:  Procedure Laterality Date   MASTOIDECTOMY      Family History  Problem Relation Age of Onset   Healthy Mother    Alcohol abuse Father    Drug abuse Father     Social History   Tobacco Use   Smoking status: Never   Smokeless tobacco: Never  Vaping Use   Vaping Use: Never used  Substance Use Topics   Alcohol use: Never   Drug use: Never    ROS   Objective:   Vitals: BP (!) 124/58 (BP Location: Right Arm)    Pulse 91    Temp 98.1 F (36.7 C) (Oral)    Resp 16    LMP  (Within Months) Comment: 1 month   SpO2 97%   Physical Exam Constitutional:      General: She is not in acute distress.    Appearance: Normal appearance. She is well-developed. She is not ill-appearing, toxic-appearing or diaphoretic.  HENT:     Head: Normocephalic and atraumatic.     Right Ear: Ear canal and external  ear normal. No drainage or tenderness. No middle ear effusion. There is no impacted cerumen. Tympanic membrane is not erythematous.     Left Ear: Tympanic membrane, ear canal and external ear normal. No drainage or tenderness.  No middle ear effusion. There is no impacted cerumen. Tympanic membrane is not erythematous.     Ears:     Comments: Tympanostomy in place for the right ear.    Nose: Nose normal. No congestion or rhinorrhea.     Mouth/Throat:     Mouth: Mucous membranes are moist. No oral lesions.     Pharynx: No pharyngeal swelling, oropharyngeal exudate, posterior oropharyngeal erythema or uvula swelling.     Tonsils: No tonsillar exudate or tonsillar abscesses.  Eyes:     General: No scleral icterus.       Right eye: No discharge.        Left eye: No discharge.     Extraocular Movements: Extraocular movements intact.     Right eye: Normal extraocular motion.     Left eye: Normal extraocular motion.     Conjunctiva/sclera: Conjunctivae normal.  Cardiovascular:     Rate and Rhythm: Normal rate.  Pulmonary:     Effort: Pulmonary effort is normal.  Musculoskeletal:     Cervical  back: Normal range of motion and neck supple.  Lymphadenopathy:     Cervical: No cervical adenopathy.  Skin:    General: Skin is warm and dry.  Neurological:     General: No focal deficit present.     Mental Status: She is alert and oriented to person, place, and time.  Psychiatric:        Mood and Affect: Mood normal.        Behavior: Behavior normal.    Assessment and Plan :   PDMP not reviewed this encounter.  1. Eustachian tube dysfunction, right   2. Right ear pain    Unremarkable ENT exam.  Will use conservative management for what I suspect is eustachian tube dysfunction.  Recommended starting Flonase, Zyrtec, Sudafed.  Follow-up with ENT otherwise.  Counseled patient on potential for adverse effects with medications prescribed/recommended today, ER and return-to-clinic precautions  discussed, patient verbalized understanding.    Wallis Bamberg, [place] 04/10/21 1251

## 2021-04-10 NOTE — ED Triage Notes (Signed)
Pt reports right ear pain x 2 days  

## 2021-06-05 ENCOUNTER — Telehealth (INDEPENDENT_AMBULATORY_CARE_PROVIDER_SITE_OTHER): Payer: Medicaid Other | Admitting: Psychiatry

## 2021-06-05 ENCOUNTER — Other Ambulatory Visit: Payer: Self-pay

## 2021-06-05 ENCOUNTER — Encounter (HOSPITAL_COMMUNITY): Payer: Self-pay | Admitting: Psychiatry

## 2021-06-05 DIAGNOSIS — F900 Attention-deficit hyperactivity disorder, predominantly inattentive type: Secondary | ICD-10-CM | POA: Diagnosis not present

## 2021-06-05 DIAGNOSIS — F840 Autistic disorder: Secondary | ICD-10-CM

## 2021-06-05 MED ORDER — ATOMOXETINE HCL 80 MG PO CAPS: ORAL_CAPSULE | ORAL | 3 refills | Status: DC

## 2021-06-05 MED ORDER — GUANFACINE HCL ER 1 MG PO TB24: 1.0000 mg | ORAL_TABLET | Freq: Three times a day (TID) | ORAL | 3 refills | Status: DC

## 2021-06-05 NOTE — Progress Notes (Signed)
Virtual Visit via Video Note  I connected with Rebekah Haynes on 06/05/21 at  1:20 PM EST by a video enabled telemedicine application and verified that I am speaking with the correct person using two identifiers.  Location: Patient: home Provider: office   I discussed the limitations of evaluation and management by telemedicine and the availability of in person appointments. The patient expressed understanding and agreed to proceed.      I discussed the assessment and treatment plan with the patient. The patient was provided an opportunity to ask questions and all were answered. The patient agreed with the plan and demonstrated an understanding of the instructions.   The patient was advised to call back or seek an in-person evaluation if the symptoms worsen or if the condition fails to improve as anticipated.  I provided 15 minutes of non-face-to-face time during this encounter.   Diannia Ruder, MD  [facility] MD/PA/NP OP Progress Note  06/05/2021 1:40 PM HERMENIA Haynes  MRN:  [id number]  Chief Complaint:  Chief Complaint  Patient presents with   ADHD   Follow-up   HPI: This patient is a 26 year old single white female who lives with her mother stepfather and sister in [place].  She helps take care of her elderly great grandmother who has dementia.  She is on disability for autistic spectrum disorder and ADHD.  Her mother is her payee.  The patient returns for follow-up after long absence.  She was last seen 10 months ago.  She is following up regarding the ADHD and agitation.  The patient states that nothing much is changed in the intervening time.  She is getting along well with her family.  She denies depression or anxiety.  She tries to remember to take her medications most of the time.  Her focus has been good.  She is now working on learning to create both findings.  Last year she built a Lobby[employer].  She is trying to stay busy and active.  She is sleeping well at night.   She and her family go to [employer] fairly often and she enjoys this. Visit Diagnosis:    ICD-10-CM   1. Attention deficit hyperactivity disorder (ADHD), predominantly inattentive type  F90.0     2. Autistic spectrum disorder  F84.0       Past Psychiatric History: The patient was treated by Dr. [name] in [place] for approximately 7 years with a diagnosis of ADHD and autism.  For most of this time she was on Strattera 80 mg daily and Intuniv 3 mg daily  Past Medical History:  Past Medical History:  Diagnosis Date   ADHD (attention deficit hyperactivity disorder)    Autistic spectrum disorder    MRSA (methicillin resistant Staphylococcus aureus)    Reflux     Past Surgical History:  Procedure Laterality Date   MASTOIDECTOMY      Family Psychiatric History: see below  Family History:  Family History  Problem Relation Age of Onset   Healthy Mother    Alcohol abuse Father    Drug abuse Father     Social History:  Social History   Socioeconomic History   Marital status: Single    Spouse name: Not on file   Number of children: Not on file   Years of education: Not on file   Highest education level: Not on file  Occupational History   Not on file  Tobacco Use   Smoking status: Never   Smokeless tobacco: Never  Vaping Use   Vaping Use: Never used  Substance and Sexual Activity   Alcohol use: Never   Drug use: Never   Sexual activity: Never  Other Topics Concern   Not on file  Social History Narrative   Not on file   Social Determinants of Health   Financial Resource Strain: Not on file  Food Insecurity: Not on file  Transportation Needs: Not on file  Physical Activity: Not on file  Stress: Not on file  Social Connections: Not on file    Allergies:  Allergies  Allergen Reactions   Clindamycin/Lincomycin     Metabolic Disorder Labs: No results found for: HGBA1C, MPG No results found for: PROLACTIN No results found for: CHOL, TRIG, HDL, CHOLHDL,  VLDL, LDLCALC Lab Results  Component Value Date   TSH 1.660 01/21/2017    Therapeutic Level Labs: No results found for: LITHIUM No results found for: VALPROATE No components found for:  CBMZ  Current Medications: Current Outpatient Medications  Medication Sig Dispense Refill   atomoxetine (STRATTERA) 80 MG capsule TAKE (1) CAPSULE BY MOUTH ONCE DAILY. 90 capsule 3   fluticasone (FLONASE) 50 MCG/ACT nasal spray Place 2 sprays into both nostrils daily. 16 g 12   guanFACINE (INTUNIV) 1 MG TB24 ER tablet Take 1 tablet (1 mg total) by mouth 3 (three) times daily. 90 tablet 3   levocetirizine (XYZAL) 5 MG tablet Take 1 tablet (5 mg total) by mouth every evening. 90 tablet 0   ofloxacin (FLOXIN) 0.3 % OTIC solution Place 10 drops into the left ear daily. X 7 days 5 mL 0   pseudoephedrine (SUDAFED) 30 MG tablet Take 1 tablet (30 mg total) by mouth every 8 (eight) hours as needed for congestion. 30 tablet 0   No current facility-administered medications for this visit.     Musculoskeletal: Strength & Muscle Tone: within normal limits Gait & Station: normal Patient leans: N/A  Psychiatric Specialty Exam: Review of Systems  All other systems reviewed and are negative.  There were no vitals taken for this visit.There is no height or weight on file to calculate BMI.  General Appearance: Casual and Fairly Groomed  Eye Contact:  Fair  Speech:  Normal Rate  Volume:  Increased  Mood:  Euthymic  Affect:  Congruent  Thought Process:  Goal Directed  Orientation:  Full (Time, Place, and Person)  Thought Content: WDL   Suicidal Thoughts:  No  Homicidal Thoughts:  No  Memory:  Immediate;   Good Recent;   Good Remote;   Good  Judgement:  Fair  Insight:  Shallow  Psychomotor Activity:  Normal  Concentration:  Concentration: Good and Attention Span: Good  Recall:  Good  Fund of Knowledge: Good  Language: Good  Akathisia:  No  Handed:  Right  AIMS (if indicated): not done  Assets:   Communication Skills Physical Health Resilience Social Support Talents/Skills  ADL's:  Intact  Cognition: WNL  Sleep:  Good   Screenings: PHQ2-9    Flowsheet Row Office Visit from 02/02/2021 in [facility] Office Visit from 10/19/2020 in [facility] Office Visit from 03/06/2017 in [facility]  PHQ-2 Total Score 0 0 0      Flowsheet Row ED from 04/10/2021 in [facility] ED from 01/15/2021 in [facility] ED from 10/10/2020 in [facility] EMERGENCY DEPARTMENT  C-SSRS RISK CATEGORY No Risk No Risk No Risk        Assessment  and Plan: This patient is a 26 year old female with a history of ADD and high functioning autistic disorder.  She continues to focus well on her current regimen and she is not having any symptoms of agitation.  She will continue Strattera 80 mg daily as well as Intuniv 1 mg 3 times daily for ADD.  She will return to see me in 6 months  Collaboration of Care: Collaboration of Care: Primary Care Provider AEB chart notes are  shared with primary provider in the epic system  Patient/Guardian was advised Release of Information must be obtained prior to any record release in order to collaborate their care with an outside provider. Patient/Guardian was advised if they have not already done so to contact the registration department to sign all necessary forms in order for [place] to release information regarding their care.   Consent: Patient/Guardian gives verbal consent for treatment and assignment of benefits for services provided during this visit. Patient/Guardian expressed understanding and agreed to proceed.    Diannia Ruder, MD 06/05/2021, 1:40 PM

## 2021-08-15 ENCOUNTER — Encounter: Payer: Self-pay | Admitting: Family Medicine

## 2021-08-15 ENCOUNTER — Telehealth: Payer: Self-pay | Admitting: Family Medicine

## 2021-08-15 ENCOUNTER — Ambulatory Visit (INDEPENDENT_AMBULATORY_CARE_PROVIDER_SITE_OTHER): Payer: Medicare Other | Admitting: Family Medicine

## 2021-08-15 DIAGNOSIS — M7918 Myalgia, other site: Secondary | ICD-10-CM

## 2021-08-15 DIAGNOSIS — D72829 Elevated white blood cell count, unspecified: Secondary | ICD-10-CM

## 2021-08-15 DIAGNOSIS — Z1329 Encounter for screening for other suspected endocrine disorder: Secondary | ICD-10-CM

## 2021-08-15 DIAGNOSIS — Z79899 Other long term (current) drug therapy: Secondary | ICD-10-CM

## 2021-08-15 DIAGNOSIS — Z13220 Encounter for screening for lipoid disorders: Secondary | ICD-10-CM

## 2021-08-15 MED ORDER — MELOXICAM 15 MG PO TABS: 15.0000 mg | ORAL_TABLET | Freq: Every day | ORAL | 0 refills | Status: DC | PRN

## 2021-08-15 MED ORDER — TIZANIDINE HCL 4 MG PO TABS: 4.0000 mg | ORAL_TABLET | Freq: Three times a day (TID) | ORAL | 0 refills | Status: DC | PRN

## 2021-08-15 NOTE — Progress Notes (Signed)
? ?Subjective:  ?Patient ID: Rebekah Haynes, female    DOB: 01-08-1996  Age: 26 y.o. MRN: [id number] ? ?CC: ?Chief Complaint  ?Patient presents with  ? Back Pain  ?  Over the past week, pt was pulling weeds. Tylenol, Advil, Ice-no relief. Has been radiating to lower arm and one day went to hip.   ? ? ?HPI: ? ?26 year old female presents for evaluation of the above. ? ?Started on Thursday.  Started after she was pulling weeds.  She reports diffuse back pain but predominantly around the scapula.  She has tried Tylenol, Advil, and ice without relief.  No radicular symptoms.  Patient also notes that she has had some pain behind her left arm.  No fall or direct trauma.  No other associated symptoms.  No other complaints. ? ?Patient Active Problem List  ? Diagnosis Date Noted  ? Musculoskeletal pain 08/15/2021  ? Attention deficit hyperactivity disorder (ADHD) 04/30/2018  ? Autistic spectrum disorder 04/30/2018  ? ? ?Social Hx   ?Social History  ? ?Socioeconomic History  ? Marital status: Single  ?  Spouse name: Not on file  ? Number of children: Not on file  ? Years of education: Not on file  ? Highest education level: Not on file  ?Occupational History  ? Not on file  ?Tobacco Use  ? Smoking status: Never  ? Smokeless tobacco: Never  ?Vaping Use  ? Vaping Use: Never used  ?Substance and Sexual Activity  ? Alcohol use: Never  ? Drug use: Never  ? Sexual activity: Never  ?Other Topics Concern  ? Not on file  ?Social History Narrative  ? Not on file  ? ?Social Determinants of Health  ? ?Financial Resource Strain: Not on file  ?Food Insecurity: Not on file  ?Transportation Needs: Not on file  ?Physical Activity: Not on file  ?Stress: Not on file  ?Social Connections: Not on file  ? ? ?Review of Systems ?Per HPI ? ?Objective:  ?BP 110/64   Pulse 88   Temp 98.1 ?F (36.7 ?C)   Wt 186 lb 6.4 oz (84.6 kg)   SpO2 100%   BMI 34.65 kg/m?  ? ? ?  08/15/2021  ?  8:45 AM 04/10/2021  ? 12:20 PM 02/02/2021  ? 10:26 AM  ?BP/Weight   ?Systolic BP [id number]  ?Diastolic BP 64 58 83  ?Wt. (Lbs) 186.4  181  ?BMI 34.65 kg/m2  33.65 kg/m2  ? ? ?Physical Exam ?Vitals and nursing note reviewed.  ?Constitutional:   ?   General: She is not in acute distress. ?   Appearance: Normal appearance. She is obese.  ?HENT:  ?   Head: Normocephalic and atraumatic.  ?Eyes:  ?   General:     ?   Right eye: No discharge.     ?   Left eye: No discharge.  ?   Conjunctiva/sclera: Conjunctivae normal.  ?Cardiovascular:  ?   Rate and Rhythm: Normal rate and regular rhythm.  ?Pulmonary:  ?   Effort: Pulmonary effort is normal. No respiratory distress.  ?   Breath sounds: Normal breath sounds.  ?Musculoskeletal:  ?   Comments: Tenderness around the medial aspect of the scapula bilaterally.  ?Neurological:  ?   Mental Status: She is alert.  ? ? ?Lab Results  ?Component Value Date  ? WBC 7.6 10/19/2020  ? HGB 12.3 10/19/2020  ? HCT 39.2 10/19/2020  ? PLT 478 (H) 10/19/2020  ? GLUCOSE 93 10/19/2020  ?  ALT 23 10/19/2020  ? AST 17 10/19/2020  ? NA 139 10/19/2020  ? K 4.7 10/19/2020  ? CL 101 10/19/2020  ? CREATININE 0.72 10/19/2020  ? BUN 7 10/19/2020  ? CO2 23 10/19/2020  ? TSH 1.660 01/21/2017  ? ? ? ?Assessment & Plan:  ? ?Problem List Items Addressed This Visit   ? ?  ? Other  ? Musculoskeletal pain  ?  Treating with meloxicam and Zanaflex. ? ?  ?  ? ? ?Meds ordered this encounter  ?Medications  ? meloxicam (MOBIC) 15 MG tablet  ?  Sig: Take 1 tablet (15 mg total) by mouth daily as needed for pain.  ?  Dispense:  30 tablet  ?  Refill:  0  ? tiZANidine (ZANAFLEX) 4 MG tablet  ?  Sig: Take 1 tablet (4 mg total) by mouth every 8 (eight) hours as needed for muscle spasms.  ?  Dispense:  30 tablet  ?  Refill:  0  ? ?Thersa Salt DO ?[place] ? ?

## 2021-08-15 NOTE — Telephone Encounter (Signed)
Last labs completed 10/19/20-CBC, BMET, Lipase, Hepatic. Please advise. Thank you ?

## 2021-08-15 NOTE — Assessment & Plan Note (Signed)
Treating with meloxicam and Zanaflex. ?

## 2021-08-15 NOTE — Patient Instructions (Signed)
Medication as directed. ? ?Rest. Heat. ? ?Call with concerns. ? ?Take care ? ?Dr. Adriana Simas  ?

## 2021-08-15 NOTE — Telephone Encounter (Signed)
Patient has physical on 6/16 and needing labs done ?

## 2021-08-17 NOTE — Telephone Encounter (Signed)
Pt returned call and verbalized understanding  

## 2021-08-17 NOTE — Telephone Encounter (Signed)
Labs orders placed. Left message to return call 

## 2021-09-21 ENCOUNTER — Ambulatory Visit: Payer: Self-pay | Admitting: Nurse Practitioner

## 2021-10-31 LAB — CBC WITH DIFFERENTIAL/PLATELET
Basophils Absolute: 0.1 10*3/uL (ref 0.0–0.2)
Basos: 1 %
EOS (ABSOLUTE): 0.2 10*3/uL (ref 0.0–0.4)
Eos: 2 %
Hematocrit: 37.4 % (ref 34.0–46.6)
Hemoglobin: 12.5 g/dL (ref 11.1–15.9)
Immature Grans (Abs): 0.1 10*3/uL (ref 0.0–0.1)
Immature Granulocytes: 1 %
Lymphocytes Absolute: 2.5 10*3/uL (ref 0.7–3.1)
Lymphs: 26 %
MCH: 27.8 pg (ref 26.6–33.0)
MCHC: 33.4 g/dL (ref 31.5–35.7)
MCV: 83 fL (ref 79–97)
Monocytes Absolute: 0.6 10*3/uL (ref 0.1–0.9)
Monocytes: 6 %
Neutrophils Absolute: 6.2 10*3/uL (ref 1.4–7.0)
Neutrophils: 64 %
Platelets: 410 10*3/uL (ref 150–450)
RBC: 4.49 x10E6/uL (ref 3.77–5.28)
RDW: 12.5 % (ref 11.7–15.4)
WBC: 9.6 10*3/uL (ref 3.4–10.8)

## 2021-10-31 LAB — COMPREHENSIVE METABOLIC PANEL WITH GFR
ALT: 13 IU/L (ref 0–32)
AST: 12 IU/L (ref 0–40)
Albumin/Globulin Ratio: 1.3 (ref 1.2–2.2)
Albumin: 4 g/dL (ref 4.0–5.0)
Alkaline Phosphatase: 67 IU/L (ref 44–121)
BUN/Creatinine Ratio: 15 (ref 9–23)
BUN: 9 mg/dL (ref 6–20)
Bilirubin Total: 0.2 mg/dL (ref 0.0–1.2)
CO2: 19 mmol/L — ABNORMAL LOW (ref 20–29)
Calcium: 9.2 mg/dL (ref 8.7–10.2)
Chloride: 102 mmol/L (ref 96–106)
Creatinine, Ser: 0.62 mg/dL (ref 0.57–1.00)
Globulin, Total: 3.1 g/dL (ref 1.5–4.5)
Glucose: 112 mg/dL — ABNORMAL HIGH (ref 70–99)
Potassium: 4.5 mmol/L (ref 3.5–5.2)
Sodium: 140 mmol/L (ref 134–144)
Total Protein: 7.1 g/dL (ref 6.0–8.5)
eGFR: 127 mL/min/1.73

## 2021-10-31 LAB — LIPID PANEL
Chol/HDL Ratio: 5.1 ratio — ABNORMAL HIGH (ref 0.0–4.4)
Cholesterol, Total: 209 mg/dL — ABNORMAL HIGH (ref 100–199)
HDL: 41 mg/dL (ref >39)
LDL Chol Calc (NIH): 137 mg/dL — ABNORMAL HIGH (ref 0–99)
Triglycerides: 170 mg/dL — ABNORMAL HIGH (ref 0–149)
VLDL Cholesterol Cal: 31 mg/dL (ref 5–40)

## 2021-10-31 LAB — TSH: TSH: 2.11 u[IU]/mL (ref 0.450–4.500)

## 2021-11-02 ENCOUNTER — Encounter: Payer: Self-pay | Admitting: Nurse Practitioner

## 2021-11-02 ENCOUNTER — Ambulatory Visit (INDEPENDENT_AMBULATORY_CARE_PROVIDER_SITE_OTHER): Payer: Medicare Other | Admitting: Nurse Practitioner

## 2021-11-02 VITALS — BP 124/64 | HR 85 | Temp 97.5°F | Ht 61.0 in | Wt 187.0 lb

## 2021-11-02 DIAGNOSIS — R632 Polyphagia: Secondary | ICD-10-CM | POA: Diagnosis not present

## 2021-11-02 DIAGNOSIS — E669 Obesity, unspecified: Secondary | ICD-10-CM | POA: Diagnosis not present

## 2021-11-02 DIAGNOSIS — F5081 Binge eating disorder: Secondary | ICD-10-CM | POA: Diagnosis not present

## 2021-11-02 DIAGNOSIS — Z6835 Body mass index (BMI) 35.0-35.9, adult: Secondary | ICD-10-CM | POA: Diagnosis not present

## 2021-11-02 DIAGNOSIS — Z23 Encounter for immunization: Secondary | ICD-10-CM | POA: Diagnosis not present

## 2021-11-02 DIAGNOSIS — Z01419 Encounter for gynecological examination (general) (routine) without abnormal findings: Secondary | ICD-10-CM

## 2021-11-02 NOTE — Progress Notes (Unsigned)
Subjective:    Patient ID: Rebekah Haynes, female    DOB: 1995-06-19, 25 y.o.   MRN: [id number]  HPI The patient comes in today for a wellness visit.    A review of their health history was completed.  A review of medications was also completed.  Any needed refills;none  Eating habits: fair  Falls/  MVA accidents in past few months: none  Regular exercise: no  Specialist pt sees on regular basis: no  Preventative health issues were discussed.   Additional concerns: none  No history of sexual activity. Mother is present today. Defers being interviewed alone.  Receives SSI. No part time employment. Regular cycles, heavy flow. Mother is interested in exploring the idea of an IUD to help her cycles. She is not consistent in taking pills.  Mom plans to schedule her dental exam.  Review of Systems  Constitutional:  Negative for activity change, appetite change and fatigue.  HENT:  Negative for sore throat and trouble swallowing.   Eyes:  Negative for visual disturbance.  Respiratory:  Negative for cough, chest tightness, shortness of breath and wheezing.   Cardiovascular:  Negative for chest pain.  Gastrointestinal:  Negative for abdominal distention, abdominal pain, constipation, diarrhea, nausea and vomiting.  Genitourinary:  Positive for menstrual problem. Negative for difficulty urinating, dysuria, enuresis, frequency, genital sores, pelvic pain, urgency and vaginal discharge.       Denies rash or vulvar irritation.       11/02/2021   10:53 AM  Depression screen PHQ 2/9  Decreased Interest 0  Down, Depressed, Hopeless 0  PHQ - 2 Score 0        Objective:   Physical Exam Vitals and nursing note reviewed. Exam conducted with a chaperone present.  Constitutional:      General: She is not in acute distress.    Appearance: She is well-developed.  Neck:     Thyroid: No thyromegaly.     Trachea: No tracheal deviation.     Comments: Thyroid non tender to  palpation. No mass or goiter noted.  Cardiovascular:     Rate and Rhythm: Normal rate and regular rhythm.     Heart sounds: Normal heart sounds. No murmur heard. Pulmonary:     Effort: Pulmonary effort is normal.     Breath sounds: Normal breath sounds.  Abdominal:     General: There is no distension.     Palpations: Abdomen is soft.     Tenderness: There is no abdominal tenderness.  Musculoskeletal:     Cervical back: Normal range of motion and neck supple.  Lymphadenopathy:     Cervical: No cervical adenopathy.     Upper Body:     Right upper body: No supraclavicular adenopathy.     Left upper body: No supraclavicular adenopathy.  Skin:    General: Skin is warm and dry.     Findings: No rash.  Neurological:     Mental Status: She is alert and oriented to person, place, and time.  Psychiatric:        Mood and Affect: Mood normal.        Behavior: Behavior normal.        Thought Content: Thought content normal.        Judgment: Judgment normal.   Today's Vitals   11/02/21 1053  BP: 124/64  Pulse: 85  Temp: (!) 97.5 F (36.4 C)  SpO2: 98%  Weight: 187 lb (84.8 kg)  Height: 5\' 1"  (1.549 m)  Body mass index is 35.33 kg/m. Results for orders placed or performed in visit on 08/15/21  CBC with Differential  Result Value Ref Range   WBC 9.6 3.4 - 10.8 x10E3/uL   RBC 4.49 3.77 - 5.28 x10E6/uL   Hemoglobin 12.5 11.1 - 15.9 g/dL   Hematocrit 37.4 34.0 - 46.6 %   MCV 83 79 - 97 fL   MCH 27.8 26.6 - 33.0 pg   MCHC 33.4 31.5 - 35.7 g/dL   RDW 12.5 11.7 - 15.4 %   Platelets 410 150 - 450 x10E3/uL   Neutrophils 64 Not Estab. %   Lymphs 26 Not Estab. %   Monocytes 6 Not Estab. %   Eos 2 Not Estab. %   Basos 1 Not Estab. %   Neutrophils Absolute 6.2 1.4 - 7.0 x10E3/uL   Lymphocytes Absolute 2.5 0.7 - 3.1 x10E3/uL   Monocytes Absolute 0.6 0.1 - 0.9 x10E3/uL   EOS (ABSOLUTE) 0.2 0.0 - 0.4 x10E3/uL   Basophils Absolute 0.1 0.0 - 0.2 x10E3/uL   Immature Granulocytes 1 Not  Estab. %   Immature Grans (Abs) 0.1 0.0 - 0.1 x10E3/uL  Comprehensive Metabolic Panel (CMET)  Result Value Ref Range   Glucose 112 (H) 70 - 99 mg/dL   BUN 9 6 - 20 mg/dL   Creatinine, Ser 0.62 0.57 - 1.00 mg/dL   eGFR 127 >59 mL/min/1.73   BUN/Creatinine Ratio 15 9 - 23   Sodium 140 134 - 144 mmol/L   Potassium 4.5 3.5 - 5.2 mmol/L   Chloride 102 96 - 106 mmol/L   CO2 19 (L) 20 - 29 mmol/L   Calcium 9.2 8.7 - 10.2 mg/dL   Total Protein 7.1 6.0 - 8.5 g/dL   Albumin 4.0 4.0 - 5.0 g/dL   Globulin, Total 3.1 1.5 - 4.5 g/dL   Albumin/Globulin Ratio 1.3 1.2 - 2.2   Bilirubin Total 0.2 0.0 - 1.2 mg/dL   Alkaline Phosphatase 67 44 - 121 IU/L   AST 12 0 - 40 IU/L   ALT 13 0 - 32 IU/L  Lipid Profile  Result Value Ref Range   Cholesterol, Total 209 (H) 100 - 199 mg/dL   Triglycerides 170 (H) 0 - 149 mg/dL   HDL 41 >39 mg/dL   VLDL Cholesterol Cal 31 5 - 40 mg/dL   LDL Chol Calc (NIH) 137 (H) 0 - 99 mg/dL   Chol/HDL Ratio 5.1 (H) 0.0 - 4.4 ratio  TSH  Result Value Ref Range   TSH 2.110 0.450 - 4.500 uIU/mL   Reviewed labs with patient and her mother. Not sure if she was completely fasting. Lifestyle changes and repeat in 6 months.       Assessment & Plan:   Problem List Items Addressed This Visit       Other   Binge eating   Obesity (BMI 30-39.9)   Other Visit Diagnoses     Well woman exam    -  Primary   Need for vaccination          Discussed HPV vaccine. Defers for now.  Hold on Tdap today due to insurance coverage. Recommend this at local pharmacy especially if any burns or open wounds. Wants to consider IUD. PAP can be done at the same time if she decides to do this. Contact office if she wants to proceed with referral to [facility]. Recommend considering Vyvanse for binge eating. Discussed healthy diet. Encouraged her to start riding her bike again.  Return in  about 6 months (around 05/05/2022). Repeat labs at that time.

## 2021-11-02 NOTE — Patient Instructions (Signed)
Vyvanse

## 2021-11-03 ENCOUNTER — Encounter: Payer: Self-pay | Admitting: Nurse Practitioner

## 2021-11-03 DIAGNOSIS — R632 Polyphagia: Secondary | ICD-10-CM | POA: Insufficient documentation

## 2021-11-03 DIAGNOSIS — E669 Obesity, unspecified: Secondary | ICD-10-CM | POA: Insufficient documentation

## 2021-11-06 NOTE — Addendum Note (Signed)
Addended by: Marlowe Shores on: 11/06/2021 10:22 AM   Modules accepted: Orders

## 2021-11-06 NOTE — Progress Notes (Signed)
11/06/21 referral placed in Epic

## 2021-11-13 ENCOUNTER — Ambulatory Visit: Payer: Medicare Other | Admitting: Registered"

## 2021-12-04 ENCOUNTER — Ambulatory Visit (INDEPENDENT_AMBULATORY_CARE_PROVIDER_SITE_OTHER): Payer: Medicare Other | Admitting: Family Medicine

## 2021-12-04 DIAGNOSIS — B372 Candidiasis of skin and nail: Secondary | ICD-10-CM

## 2021-12-04 MED ORDER — CLOTRIMAZOLE 1 % EX CREA: 1.0000 | TOPICAL_CREAM | Freq: Two times a day (BID) | CUTANEOUS | 0 refills | Status: DC

## 2021-12-04 NOTE — Progress Notes (Signed)
Subjective:  Patient ID: Rebekah Haynes, female    DOB: February 10, 1996  Age: 26 y.o. MRN: [id number]  CC: Chief Complaint  Patient presents with   Rash    Underneath breast spreading towards arm pit for a couple of weeks, has applied diaper rash ointment    HPI:  26 year old female presents for evaluation of the above.  Patient reports that she has had a rash underneath the breasts for the past couple of weeks.  Seems to be not resolving.  Now affecting the axilla as well.  She has been applying diaper ointment with some improvement but no complete resolution.  Mild discomfort.  No significant itching.  No other associated symptoms.  Patient Active Problem List   Diagnosis Date Noted   Intertriginous candidiasis 12/04/2021   Binge eating 11/03/2021   Obesity (BMI 30-39.9) 11/03/2021   Musculoskeletal pain 08/15/2021   Attention deficit hyperactivity disorder (ADHD) 04/30/2018   Autistic spectrum disorder 04/30/2018    Social Hx   Social History   Socioeconomic History   Marital status: Single    Spouse name: Not on file   Number of children: Not on file   Years of education: Not on file   Highest education level: Not on file  Occupational History   Not on file  Tobacco Use   Smoking status: Never   Smokeless tobacco: Never  Vaping Use   Vaping Use: Never used  Substance and Sexual Activity   Alcohol use: Never   Drug use: Never   Sexual activity: Never  Other Topics Concern   Not on file  Social History Narrative   Not on file   Social Determinants of Health   Financial Resource Strain: Not on file  Food Insecurity: Not on file  Transportation Needs: Not on file  Physical Activity: Not on file  Stress: Not on file  Social Connections: Not on file    Review of Systems Per HPI  Objective:  BP 118/84   Pulse 86   Temp 98.6 F (37 C)   Ht 5\' 1"  (1.549 m)   Wt 186 lb (84.4 kg)   LMP 11/27/2021 (Approximate)   SpO2 100%   BMI 35.14 kg/m       12/04/2021    9:45 AM 11/02/2021   10:53 AM 08/15/2021    8:45 AM  BP/Weight  Systolic BP 118 124 110  Diastolic BP 84 64 64  Wt. (Lbs) 186 187 186.4  BMI 35.14 kg/m2 35.33 kg/m2 34.65 kg/m2    Physical Exam Vitals and nursing note reviewed. Exam conducted with a chaperone present.  Constitutional:      Appearance: Normal appearance. She is obese.  HENT:     Head: Normocephalic and atraumatic.  Cardiovascular:     Rate and Rhythm: Normal rate and regular rhythm.  Pulmonary:     Effort: Pulmonary effort is normal.     Breath sounds: Normal breath sounds.  Skin:    Comments: Mild erythema and slight maceration underneath the breast bilaterally.  Neurological:     Mental Status: She is alert.     Lab Results  Component Value Date   WBC 9.6 10/30/2021   HGB 12.5 10/30/2021   HCT 37.4 10/30/2021   PLT 410 10/30/2021   GLUCOSE 112 (H) 10/30/2021   CHOL 209 (H) 10/30/2021   TRIG 170 (H) 10/30/2021   HDL 41 10/30/2021   LDLCALC 137 (H) 10/30/2021   ALT 13 10/30/2021   AST 12 10/30/2021   NA  140 10/30/2021   K 4.5 10/30/2021   CL 102 10/30/2021   CREATININE 0.62 10/30/2021   BUN 9 10/30/2021   CO2 19 (L) 10/30/2021   TSH 2.110 10/30/2021     Assessment & Plan:   Problem List Items Addressed This Visit       Musculoskeletal and Integument   Intertriginous candidiasis    Discussed topical versus oral treatment.  Mother and patient elected for topical treatment.  Sending in clotrimazole.      Relevant Medications   clotrimazole (LOTRIMIN) 1 % cream    Meds ordered this encounter  Medications   clotrimazole (LOTRIMIN) 1 % cream    Sig: Apply 1 Application topically 2 (two) times daily. For 2 to 4 weeks.    Dispense:  60 g    Refill:  0   Kealey Kemmer DO [facility]

## 2021-12-04 NOTE — Assessment & Plan Note (Signed)
Discussed topical versus oral treatment.  Mother and patient elected for topical treatment.  Sending in clotrimazole.

## 2021-12-04 NOTE — Patient Instructions (Signed)
Medication as prescribed.  If this fails to improve or worsens, please let me know and I will send in oral treatment.  Take care  Dr. Adriana Simas

## 2021-12-20 IMAGING — CT CT ABD-PELV W/ CM
2 of 4 series · 16 of 46 positions shown, 18 images · IV contrast (Omnipaque or Isovue)
Comparison: None.

CLINICAL DATA: Acute non localized right upper quadrant abdominal
pain and epigastric pain.

EXAM:
CT ABDOMEN AND PELVIS WITH CONTRAST
TECHNIQUE: Multidetector CT imaging of the abdomen and pelvis was performed
using the standard protocol following bolus administration of
intravenous contrast.
CONTRAST:  100mL OMNIPAQUE IOHEXOL 300 MG/ML  SOLN

[Series 2: axial st · axial · 0.89mm/px · z∈[-775,-330]mm · 13 of 97 slices shown, 15 images]
[im 4/97  soft-tissue]
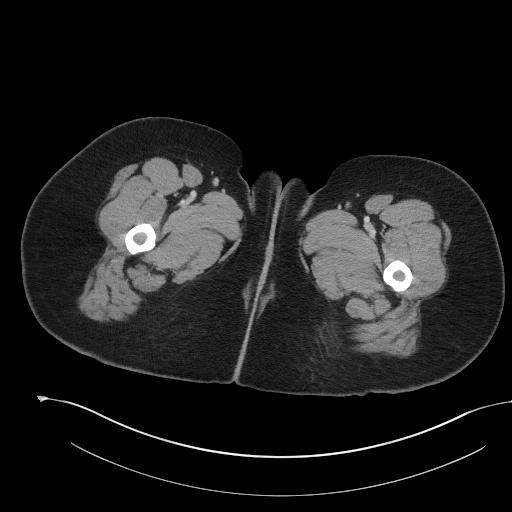
[im 4/97  bone]
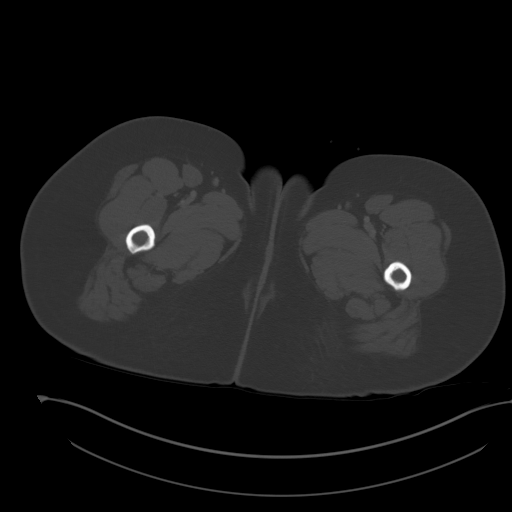
[im 12/97  soft-tissue]
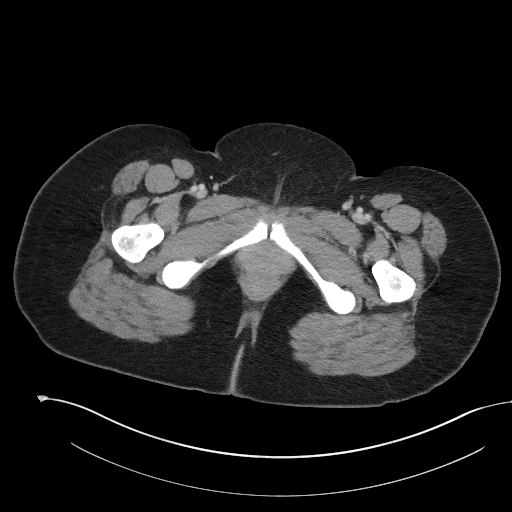
[im 20/97  soft-tissue]
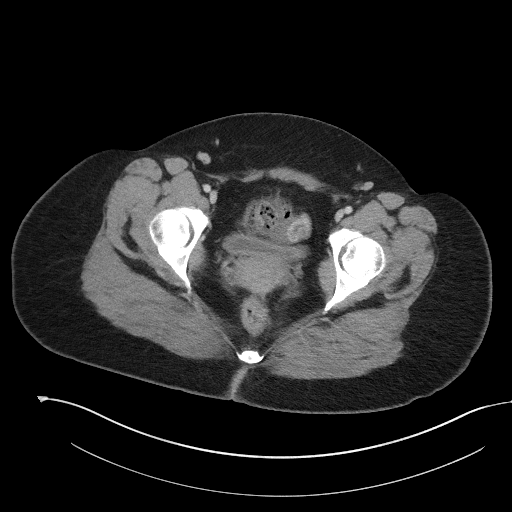
[im 27/97  soft-tissue]
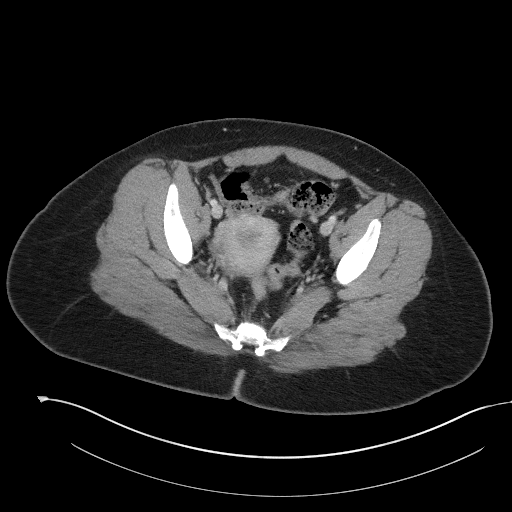
[im 35/97  soft-tissue]
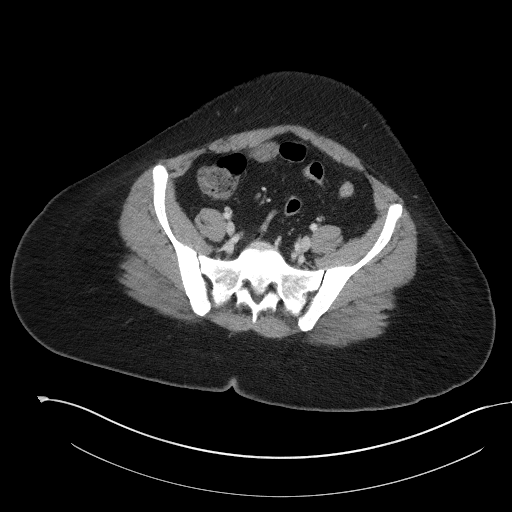
[im 43/97  soft-tissue]
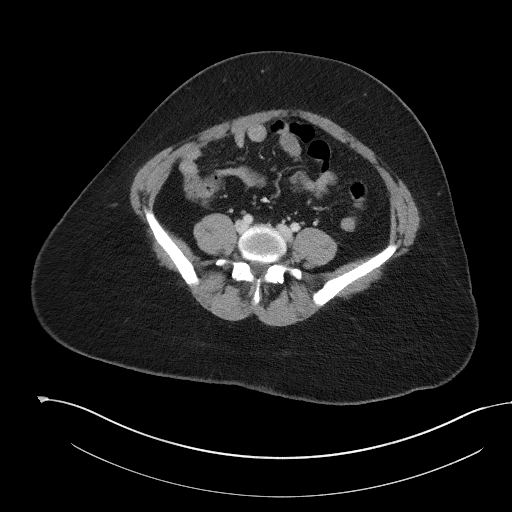
[im 50/97  soft-tissue]
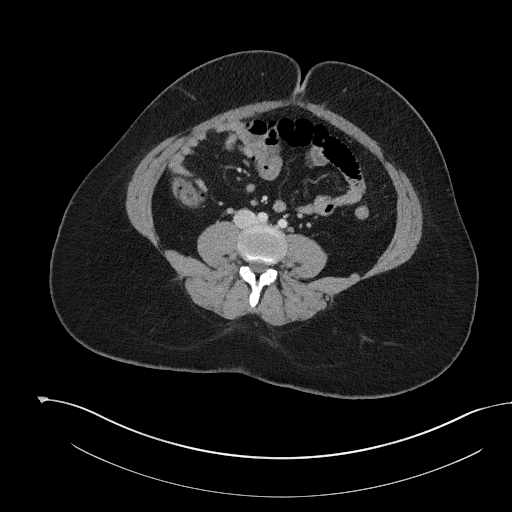
[im 54/97  soft-tissue]
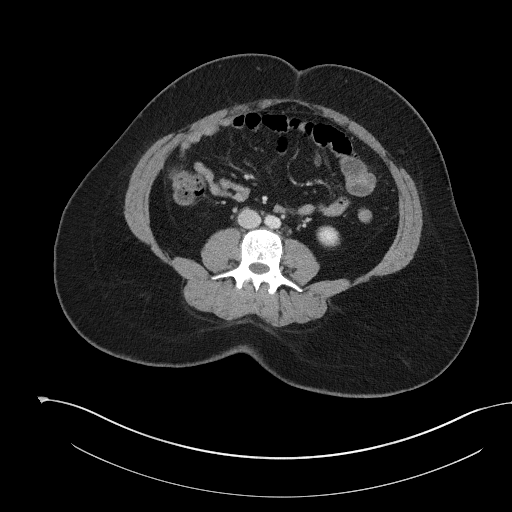
[im 62/97  soft-tissue]
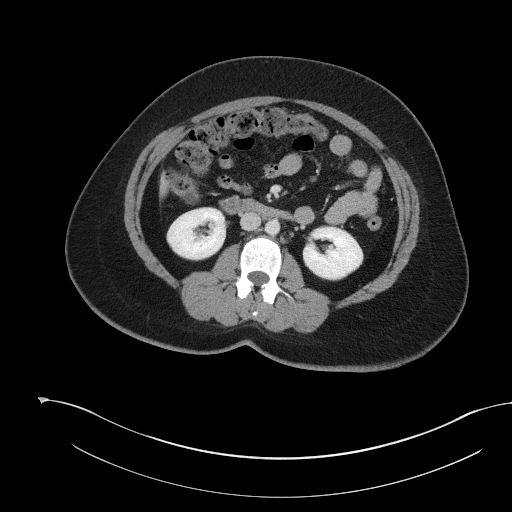
[im 62/97  bone]
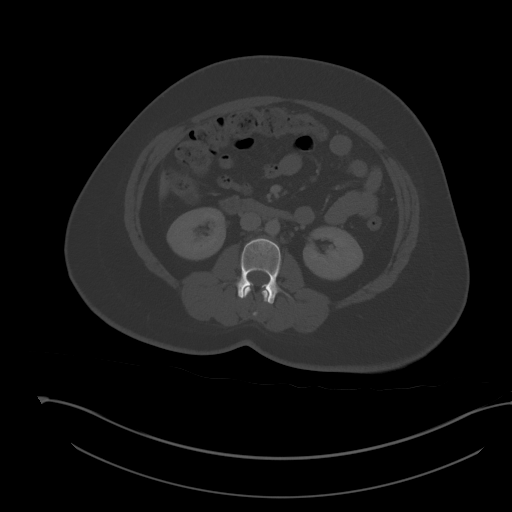
[im 70/97  soft-tissue]
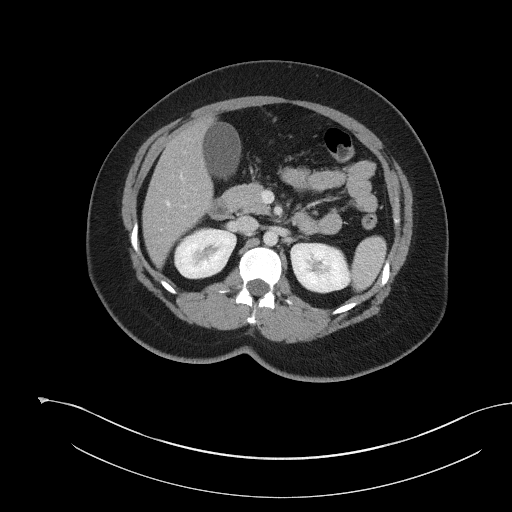
[im 77/97  soft-tissue]
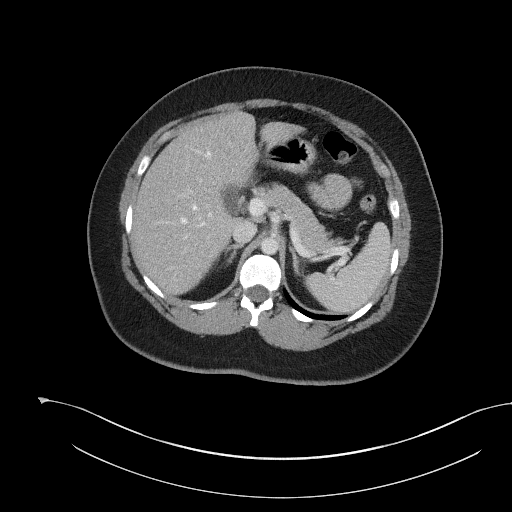
[im 85/97  soft-tissue]
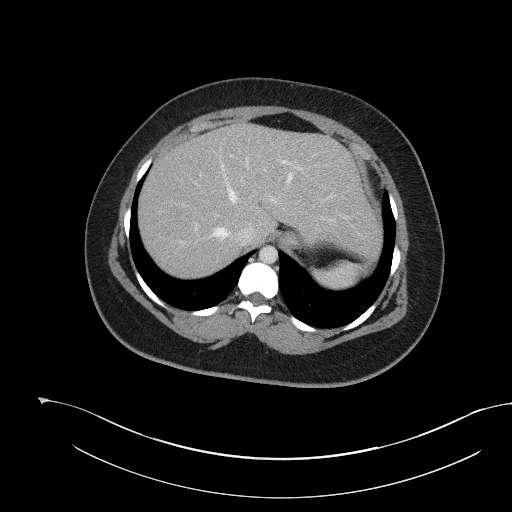
[im 93/97  soft-tissue]
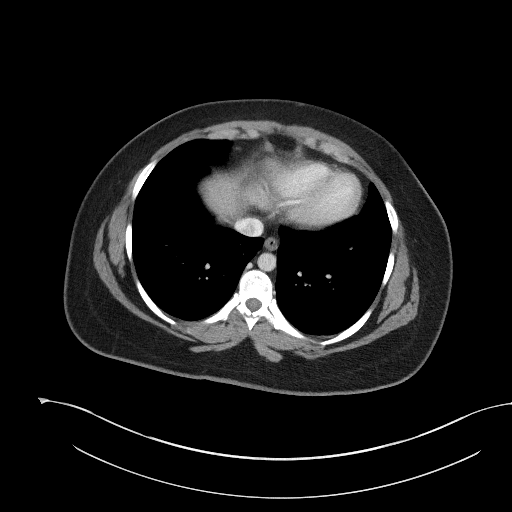

[Series 5: coronal st · coronal · 0.78mm/px · 3 of 114 slices shown]
[im 38/114  soft-tissue]
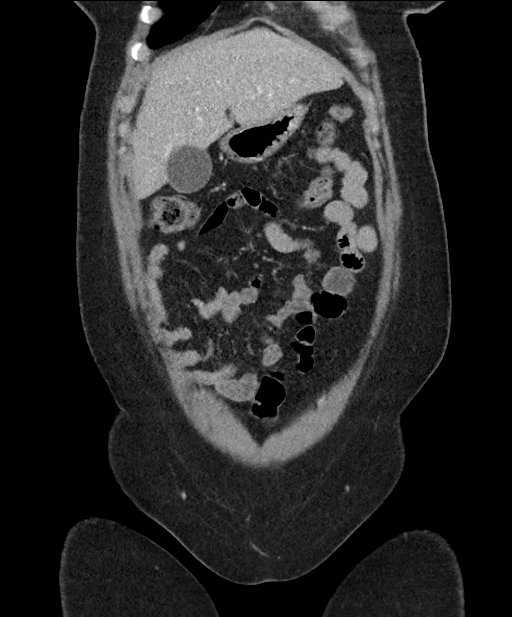
[im 51/114  soft-tissue]
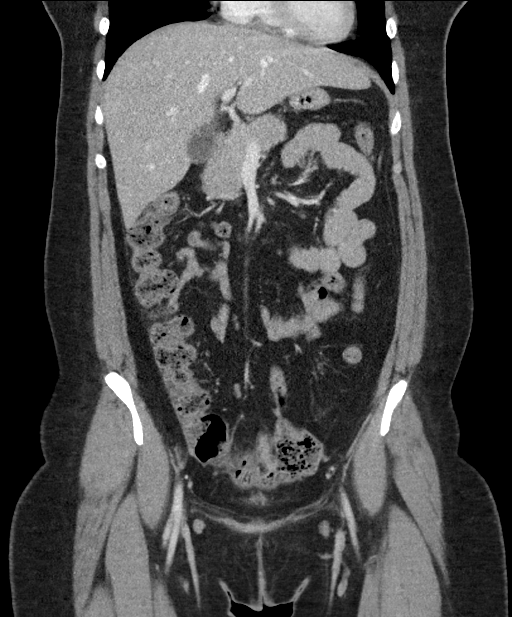
[im 63/114  soft-tissue]
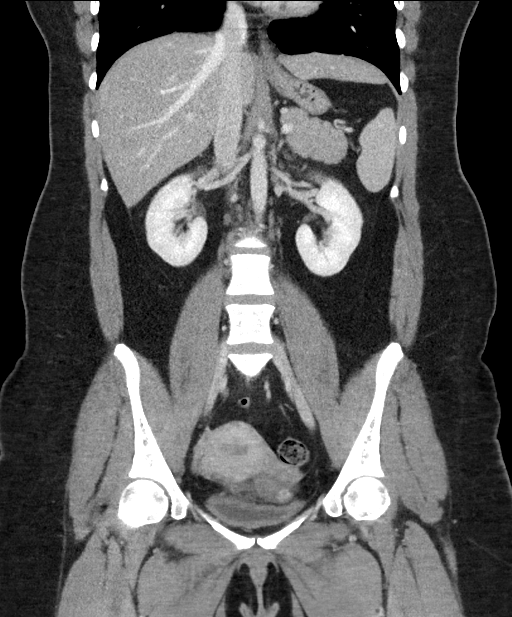

[16 of 46 positions shown; findings below may reference images not displayed]

FINDINGS: Lower chest: Lung bases are clear.

Hepatobiliary: No focal liver abnormality is seen. No gallstones,
gallbladder wall thickening, or biliary dilatation.

Pancreas: Unremarkable. No pancreatic ductal dilatation or
surrounding inflammatory changes.

Spleen: Normal in size without focal abnormality.

Adrenals/Urinary Tract: Adrenal glands are unremarkable. Kidneys are
normal, without renal calculi, focal lesion, or hydronephrosis.
Bladder is unremarkable.

Stomach/Bowel: Stomach is within normal limits. Appendix appears
normal. No evidence of bowel wall thickening, distention, or
inflammatory changes.

Vascular/Lymphatic: No significant vascular findings are present. No
enlarged abdominal or pelvic lymph nodes.

Reproductive: Uterus and ovaries are not enlarged. Involuting cyst
on the left ovary.

Other: No abdominal wall hernia or abnormality. No abdominopelvic
ascites.

Musculoskeletal: No acute or significant osseous findings.
IMPRESSION: No acute abnormalities demonstrated in the abdomen or pelvis. No
bowel obstruction or inflammation. Involuting cyst on the left
ovary.

## 2022-03-07 ENCOUNTER — Telehealth (HOSPITAL_COMMUNITY): Payer: Self-pay

## 2022-03-07 NOTE — Telephone Encounter (Signed)
Medication management - Telephone call with pharmacist at [facility], after completing patient's prior authorization online with CoverMyMeds for patient's prescribed Guanfacine HCL 1 mg ER, #90 for 30 days. Requested pharmacist have patient call to schedule a new appointment when she picks up her refill.

## 2022-05-09 ENCOUNTER — Ambulatory Visit (INDEPENDENT_AMBULATORY_CARE_PROVIDER_SITE_OTHER): Payer: Medicare Other | Admitting: Family Medicine

## 2022-05-09 VITALS — BP 122/83 | HR 110 | Temp 98.7°F | Ht 61.0 in | Wt 188.0 lb

## 2022-05-09 DIAGNOSIS — R197 Diarrhea, unspecified: Secondary | ICD-10-CM | POA: Diagnosis not present

## 2022-05-09 DIAGNOSIS — R112 Nausea with vomiting, unspecified: Secondary | ICD-10-CM | POA: Diagnosis not present

## 2022-05-09 MED ORDER — PANTOPRAZOLE SODIUM 40 MG PO TBEC: 40.0000 mg | DELAYED_RELEASE_TABLET | Freq: Every day | ORAL | 3 refills | Status: DC

## 2022-05-09 NOTE — Assessment & Plan Note (Signed)
Patient with recent nausea, vomiting, and and diarrhea.  Also experiencing abdominal pain.  There is no focality to her abdominal exam.  Labs for further evaluation.  I do not feel that imaging is warranted at this time.  Placing on Protonix.  Zofran as needed.

## 2022-05-09 NOTE — Progress Notes (Signed)
Subjective:  Patient ID: Rebekah Haynes, female    DOB: 1995-05-17  Age: 27 y.o. MRN: [id number]  CC: Chief Complaint  Patient presents with   Abdominal Pain    Vomiting , [place] vomiting a lot of stomach bile and severe abdominal pain past Saturday was given nausea meds    HPI:  27 year old female presents for evaluation of the above.  Patient accompanied by her mother today.  [place] and developed nausea and vomiting and abdominal pain.  Was seen at a urgent care and was diagnosed with gastroenteritis.  Was given Zofran.  Last bout of emesis was yesterday.  She has had diarrhea as well.  No fever.  She is continue to have abdominal pain.  Her abdominal pain is generalized.  She reports upper pain at times as well as lower abdominal pain at times.  She has eaten today without difficulty.  Patient and mother concerned given the persistence of abdominal pain.  Patient Active Problem List   Diagnosis Date Noted   Nausea vomiting and diarrhea 05/09/2022   Intertriginous candidiasis 12/04/2021   Binge eating 11/03/2021   Obesity (BMI 30-39.9) 11/03/2021   Musculoskeletal pain 08/15/2021   Attention deficit hyperactivity disorder (ADHD) 04/30/2018   Autistic spectrum disorder 04/30/2018    Social Hx   Social History   Socioeconomic History   Marital status: Single    Spouse name: Not on file   Number of children: Not on file   Years of education: Not on file   Highest education level: Not on file  Occupational History   Not on file  Tobacco Use   Smoking status: Never   Smokeless tobacco: Never  Vaping Use   Vaping Use: Never used  Substance and Sexual Activity   Alcohol use: Never   Drug use: Never   Sexual activity: Never  Other Topics Concern   Not on file  Social History Narrative   Not on file   Social Determinants of Health   Financial Resource Strain: Not on file  Food  Insecurity: Not on file  Transportation Needs: Not on file  Physical Activity: Not on file  Stress: Not on file  Social Connections: Not on file    Review of Systems Per HPI  Objective:  BP 122/83   Pulse (!) 110   Temp 98.7 F (37.1 C)   Ht 5\' 1"  (1.549 m)   Wt 188 lb (85.3 kg)   SpO2 96%   BMI 35.52 kg/m      05/09/2022    3:30 PM 12/04/2021    9:45 AM 11/02/2021   10:53 AM  BP/Weight  Systolic BP 528 413 244  Diastolic BP 83 84 64  Wt. (Lbs) 188 186 187  BMI 35.52 kg/m2 35.14 kg/m2 35.33 kg/m2    Physical Exam Vitals and nursing note reviewed.  Constitutional:      Appearance: Normal appearance. She is obese.  HENT:     Head: Normocephalic and atraumatic.  Cardiovascular:     Rate and Rhythm: Normal rate and regular rhythm.  Pulmonary:     Effort: Pulmonary effort is normal.     Breath sounds: Normal breath sounds. No wheezing, rhonchi or rales.  Abdominal:     Comments: Soft, nondistended.  Tenderness in the epigastric region, right upper quadrant, left lower quadrant, and right lower quadrant.  Neurological:     Mental  Status: She is alert.     Lab Results  Component Value Date   WBC 9.6 10/30/2021   HGB 12.5 10/30/2021   HCT 37.4 10/30/2021   PLT 410 10/30/2021   GLUCOSE 112 (H) 10/30/2021   CHOL 209 (H) 10/30/2021   TRIG 170 (H) 10/30/2021   HDL 41 10/30/2021   LDLCALC 137 (H) 10/30/2021   ALT 13 10/30/2021   AST 12 10/30/2021   NA 140 10/30/2021   K 4.5 10/30/2021   CL 102 10/30/2021   CREATININE 0.62 10/30/2021   BUN 9 10/30/2021   CO2 19 (L) 10/30/2021   TSH 2.110 10/30/2021     Assessment & Plan:   Problem List Items Addressed This Visit       Digestive   Nausea vomiting and diarrhea - Primary    Patient with recent nausea, vomiting, and and diarrhea.  Also experiencing abdominal pain.  There is no focality to her abdominal exam.  Labs for further evaluation.  I do not feel that imaging is warranted at this time.  Placing on  Protonix.  Zofran as needed.      Relevant Orders   CBC   CMP14+EGFR   Lipase    Meds ordered this encounter  Medications   pantoprazole (PROTONIX) 40 MG tablet    Sig: Take 1 tablet (40 mg total) by mouth daily.    Dispense:  30 tablet    Refill:  3    Follow-up:  Return in about 1 week (around 05/16/2022).  [place]

## 2022-05-09 NOTE — Patient Instructions (Addendum)
Medication as directed.  Follow up in 1 week  If something changes or worsens, please let us know.

## 2022-05-10 LAB — CBC
Hematocrit: 39 % (ref 34.0–46.6)
Hemoglobin: 12.4 g/dL (ref 11.1–15.9)
MCH: 27.3 pg (ref 26.6–33.0)
MCHC: 31.8 g/dL (ref 31.5–35.7)
MCV: 86 fL (ref 79–97)
Platelets: 514 10*3/uL — ABNORMAL HIGH (ref 150–450)
RBC: 4.55 x10E6/uL (ref 3.77–5.28)
RDW: 12 % (ref 11.7–15.4)
WBC: 10.9 10*3/uL — ABNORMAL HIGH (ref 3.4–10.8)

## 2022-05-10 LAB — CMP14+EGFR
ALT: 15 IU/L (ref 0–32)
AST: 14 IU/L (ref 0–40)
Albumin/Globulin Ratio: 1.5 (ref 1.2–2.2)
Albumin: 4.4 g/dL (ref 4.0–5.0)
Alkaline Phosphatase: 65 IU/L (ref 44–121)
BUN/Creatinine Ratio: 12 (ref 9–23)
BUN: 8 mg/dL (ref 6–20)
Bilirubin Total: <0.2 mg/dL (ref 0.0–1.2)
CO2: 20 mmol/L (ref 20–29)
Calcium: 9.2 mg/dL (ref 8.7–10.2)
Chloride: 102 mmol/L (ref 96–106)
Creatinine, Ser: 0.67 mg/dL (ref 0.57–1.00)
Globulin, Total: 3 g/dL (ref 1.5–4.5)
Glucose: 109 mg/dL — ABNORMAL HIGH (ref 70–99)
Potassium: 4.4 mmol/L (ref 3.5–5.2)
Sodium: 138 mmol/L (ref 134–144)
Total Protein: 7.4 g/dL (ref 6.0–8.5)
eGFR: 124 mL/min/{1.73_m2} (ref >59)

## 2022-05-10 LAB — LIPASE: Lipase: 36 U/L (ref 14–72)

## 2022-05-17 ENCOUNTER — Ambulatory Visit (INDEPENDENT_AMBULATORY_CARE_PROVIDER_SITE_OTHER): Payer: Medicare Other | Admitting: Family Medicine

## 2022-05-17 ENCOUNTER — Encounter: Payer: Self-pay | Admitting: Family Medicine

## 2022-05-17 VITALS — BP 120/78 | Wt 188.4 lb

## 2022-05-17 DIAGNOSIS — E669 Obesity, unspecified: Secondary | ICD-10-CM | POA: Diagnosis not present

## 2022-05-17 DIAGNOSIS — B349 Viral infection, unspecified: Secondary | ICD-10-CM | POA: Diagnosis not present

## 2022-05-17 NOTE — Progress Notes (Signed)
   Subjective:    Patient ID: Rebekah Haynes, female    DOB: 29-Dec-1995, 27 y.o.   MRN: [id number]  HPI Pt arrives for follow up. Pt saw Dr.Cook on 05/09/22 for N/V/D. Pt states symptoms have improved.  Patient was having nausea vomiting diarrhea along with abdominal cramps and discomfort she is now doing better.  She feels that her stomach feels much better.  She is trying to eat healthy.  She is trying to stay active.  Review of Systems     Objective:   Physical Exam General-in no acute distress Eyes-no discharge Lungs-respiratory rate normal, CTA CV-no murmurs,RRR Extremities skin warm dry no edema Neuro grossly normal Behavior normal, alert        Assessment & Plan:  Viral syndrome Resolved Warning signs discussed in detail Weight is elevated Portion control regular physical activity increase walking Information given Female wellness later this summer

## 2022-06-06 ENCOUNTER — Encounter: Payer: Self-pay | Admitting: Radiology

## 2022-06-11 ENCOUNTER — Telehealth: Payer: Self-pay | Admitting: Family Medicine

## 2022-06-11 NOTE — Telephone Encounter (Signed)
Contacted Rebekah Haynes to schedule their annual wellness visit. Appointment made for 06/28/2022.  Thank you,  [name],  [facility] Direct Dial ??[phone]

## 2022-06-28 ENCOUNTER — Ambulatory Visit (INDEPENDENT_AMBULATORY_CARE_PROVIDER_SITE_OTHER): Payer: Medicare Other

## 2022-06-28 VITALS — BP 122/74 | Ht 61.0 in | Wt 188.0 lb

## 2022-06-28 DIAGNOSIS — Z0000 Encounter for general adult medical examination without abnormal findings: Secondary | ICD-10-CM

## 2022-06-28 NOTE — Progress Notes (Signed)
Subjective:   Rebekah Haynes is a 27 y.o. female who presents for an Initial Medicare Annual Wellness Visit.  Review of Systems     Cardiac Risk Factors include: sedentary lifestyle;obesity (BMI >30kg/m2)     Objective:    Today's Vitals   06/28/22 0956  BP: 122/74  Weight: 188 lb (85.3 kg)  Height: 5\' 1"  (1.549 m)   Body mass index is 35.52 kg/m.     06/28/2022   11:39 AM 10/10/2020    4:38 PM 10/15/2019   11:39 AM 08/23/2019   11:44 AM  Advanced Directives  Does Patient Have a Medical Advance Directive? No No No No  Would patient like information on creating a medical advance directive? No - Patient declined No - Patient declined No - Patient declined No - Patient declined    Current Medications (verified) Outpatient Encounter Medications as of 06/28/2022  Medication Sig   atomoxetine (STRATTERA) 80 MG capsule TAKE (1) CAPSULE BY MOUTH ONCE DAILY.   guanFACINE (INTUNIV) 1 MG TB24 ER tablet Take 1 tablet (1 mg total) by mouth 3 (three) times daily.   pantoprazole (PROTONIX) 40 MG tablet Take 1 tablet (40 mg total) by mouth daily.   No facility-administered encounter medications on file as of 06/28/2022.    Allergies (verified) Clindamycin/lincomycin   History: Past Medical History:  Diagnosis Date   ADHD (attention deficit hyperactivity disorder)    Autistic spectrum disorder    MRSA (methicillin resistant Staphylococcus aureus)    Reflux    Past Surgical History:  Procedure Laterality Date   MASTOIDECTOMY     Family History  Problem Relation Age of Onset   Healthy Mother    Alcohol abuse Father    Drug abuse Father    Social History   Socioeconomic History   Marital status: Single    Spouse name: Not on file   Number of children: Not on file   Years of education: Not on file   Highest education level: Not on file  Occupational History   Not on file  Tobacco Use   Smoking status: Never   Smokeless tobacco: Never  Vaping Use   Vaping Use:  Never used  Substance and Sexual Activity   Alcohol use: Never   Drug use: Never   Sexual activity: Never  Other Topics Concern   Not on file  Social History Narrative   Not on file   Social Determinants of Health   Financial Resource Strain: Low Risk  (06/28/2022)   Overall Financial Resource Strain (CARDIA)    Difficulty of Paying Living Expenses: Not hard at all  Food Insecurity: No Food Insecurity (06/28/2022)   Hunger Vital Sign    Worried About Running Out of Food in the Last Year: Never true    Ran Out of Food in the Last Year: Never true  Transportation Needs: No Transportation Needs (06/28/2022)   PRAPARE - Hydrologist (Medical): No    Lack of Transportation (Non-Medical): No  Physical Activity: Inactive (06/28/2022)   Exercise Vital Sign    Days of Exercise per Week: 0 days    Minutes of Exercise per Session: 0 min  Stress: No Stress Concern Present (06/28/2022)   [place]    Feeling of Stress : Not at all  Social Connections: Moderately Integrated (06/28/2022)   Social Connection and Isolation Panel [NHANES]    Frequency of Communication with Friends and Family: More than  three times a week    Frequency of Social Gatherings with Friends and Family: More than three times a week    Attends Religious Services: 1 to 4 times per year    Active Member of [employer] or Organizations: Yes    Attends Archivist Meetings: 1 to 4 times per year    Marital Status: Never married    Tobacco Counseling Counseling given: Not Answered   Clinical Intake:  Pre-visit preparation completed: Yes  Pain : No/denies pain     Diabetes: No  How often do you need to have someone help you when you read instructions, pamphlets, or other written materials from your doctor or pharmacy?: 3 - Sometimes  Diabetic?No   Interpreter Needed?: No  Information entered by :: Denman George  LPN   Activities of Daily Living    06/28/2022   11:39 AM  In your present state of health, do you have any difficulty performing the following activities:  Hearing? 0  Vision? 0  Difficulty concentrating or making decisions? 1  Walking or climbing stairs? 0  Dressing or bathing? 0  Doing errands, shopping? 1  Preparing Food and eating ? N  Using the Toilet? N  In the past six months, have you accidently leaked urine? N  Do you have problems with loss of bowel control? N  Managing your Medications? N  Managing your Finances? Y  Housekeeping or managing your Housekeeping? Y    Patient Care Team: Kathyrn Drown, MD as PCP - General (Family Medicine) Cloria Spring, MD as Consulting Physician [facility])  Indicate any recent Medical Services you may have received from other than [facility] providers in the past year (date may be approximate).     Assessment:   This is a routine wellness examination for Rebekah Haynes.  Hearing/Vision screen Hearing Screening - Comments:: Denies hearing difficulties  Vision Screening - Comments:: No vision problems; will schedule routine eye exam soon    Dietary issues and exercise activities discussed: Current Exercise Habits: The patient does not participate in regular exercise at present   Goals Addressed             This Visit's Progress    Change eating habits and start back walking        Depression Screen    06/28/2022   11:38 AM 05/17/2022   10:51 AM 05/09/2022    3:38 PM 11/02/2021   10:53 AM 02/02/2021   10:32 AM 10/19/2020   11:41 AM 03/06/2017   10:13 AM  PHQ 2/9 Scores  PHQ - 2 Score 0 0 1 0 0 0 0  PHQ- 9 Score 0  5        Fall Risk    06/28/2022   11:37 AM 05/17/2022   10:51 AM 05/09/2022    3:38 PM 11/02/2021   10:53 AM 02/02/2021   10:32 AM  Fall Risk   Falls in the past year? 0 0 0 0 0  Number falls in past yr: 0 0 0 0 0  Injury with Fall? 0 0 0 0 0  Risk for fall due to : No Fall Risks No Fall Risks No Fall  Risks No Fall Risks No Fall Risks  Follow up Falls prevention discussed;Education provided;Falls evaluation completed Falls evaluation completed  Falls evaluation completed Falls evaluation completed    FALL RISK PREVENTION PERTAINING TO THE HOME:  Any stairs in or around the home? No  If so, are there any without handrails? No  Home free of loose throw rugs in walkways, pet beds, electrical cords, etc? Yes  Adequate lighting in your home to reduce risk of falls? Yes   ASSISTIVE DEVICES UTILIZED TO PREVENT FALLS:  Life alert? No  Use of a cane, walker or w/c? No  Grab bars in the bathroom? Yes  Shower chair or bench in shower? No  Elevated toilet seat or a handicapped toilet? No   TIMED UP AND GO:  Was the test performed? Yes .  Length of time to ambulate 10 feet: 6 sec.   Gait steady and fast without use of assistive device  Cognitive Function:        06/28/2022   11:40 AM  6CIT Screen  What Year? 0 points  What month? 0 points  What time? 0 points  Count back from 20 0 points  Months in reverse 0 points  Repeat phrase 0 points  Total Score 0 points    Immunizations Immunization History  Administered Date(s) Administered   DTaP 06/15/1997, 08/18/1997, 10/20/1997, 02/14/1999, 04/29/2001   HIB (PRP-OMP) 06/15/1997, 08/18/1997, 04/19/1998   Hepatitis B 12-Jun-1997, 06/15/1997, 10/20/1997   IPV 06/15/1997, 08/18/1997, 02/14/1999, 04/29/2001   Influenza,inj,Quad PF,6+ Mos 01/21/2017   MMR 04/19/1998, 04/29/2001   Meningococcal Conjugate 11/03/2008   Moderna Sars-Covid-2 Vaccination 11/01/2021   Pneumococcal Conjugate-13 07/02/1999   Tdap 11/03/2008   Varicella 04/19/1998    TDAP status: Due, Education has been provided regarding the importance of this vaccine. Advised may receive this vaccine at local pharmacy or Health Dept. Aware to provide a copy of the vaccination record if obtained from local pharmacy or Health Dept. Verbalized acceptance and  understanding.  Flu Vaccine status: Declined, Education has been provided regarding the importance of this vaccine but patient still declined. Advised may receive this vaccine at local pharmacy or Health Dept. Aware to provide a copy of the vaccination record if obtained from local pharmacy or Health Dept. Verbalized acceptance and understanding.  Pneumococcal vaccine status: Up to date  Covid-19 vaccine status: Information provided on how to obtain vaccines.   Qualifies for Shingles Vaccine? No    Screening Tests Health Maintenance  Topic Date Due   HPV VACCINES (1 - 2-dose series) Never done   HIV Screening  Never done   Hepatitis C Screening  Never done   PAP-Cervical Cytology Screening  Never done   PAP SMEAR-Modifier  Never done   DTaP/Tdap/Td (7 - Td or Tdap) 11/04/2018   COVID-19 Vaccine (2 - Moderna risk series) 11/29/2021   INFLUENZA VACCINE  07/07/2022 (Originally 11/06/2021)   Medicare Annual Wellness (AWV)  06/28/2023    Health Maintenance  Health Maintenance Due  Topic Date Due   HPV VACCINES (1 - 2-dose series) Never done   HIV Screening  Never done   Hepatitis C Screening  Never done   PAP-Cervical Cytology Screening  Never done   PAP SMEAR-Modifier  Never done   DTaP/Tdap/Td (7 - Td or Tdap) 11/04/2018   COVID-19 Vaccine (2 - Moderna risk series) 11/29/2021    Lung Cancer Screening: (Low Dose CT Chest recommended if Age 13-80 years, 30 pack-year currently smoking OR have quit w/in 15years.) does not qualify.   Lung Cancer Screening Referral: n/a  Additional Screening:  Hepatitis C Screening: does qualify;  Vision Screening: Recommended annual ophthalmology exams for early detection of glaucoma and other disorders of the eye. Is the patient up to date with their annual eye exam?  No  Who is the provider or what is  the name of the office in which the patient attends annual eye exams? none If pt is not established with a provider, would they like to be  referred to a provider to establish care? No .   Dental Screening: Recommended annual dental exams for proper oral hygiene  Community Resource Referral / Chronic Care Management: CRR required this visit?  No   CCM required this visit?  No      Plan:     I have personally reviewed and noted the following in the patient's chart:   Medical and social history Use of alcohol, tobacco or illicit drugs  Current medications and supplements including opioid prescriptions. Patient is not currently taking opioid prescriptions. Functional ability and status Nutritional status Physical activity Advanced directives List of other physicians Hospitalizations, surgeries, and ER visits in previous 12 months Vitals Screenings to include cognitive, depression, and falls Referrals and appointments  In addition, I have reviewed and discussed with patient certain preventive protocols, quality metrics, and best practice recommendations. A written personalized care plan for preventive services as well as general preventive health recommendations were provided to patient.     Denman George [place], [place]   [id number]   Nurse Notes: No concerns; patient is going to [place] in May and will check with her pharmacy on vaccines that are recommended for travel

## 2022-06-28 NOTE — Patient Instructions (Signed)
Rebekah Haynes , Thank you for taking time to come for your Medicare Wellness Visit. I appreciate your ongoing commitment to your health goals. Please review the following plan we discussed and let me know if I can assist you in the future.   These are the goals we discussed:  Goals   None     This is a list of the screening recommended for you and due dates:  Health Maintenance  Topic Date Due   Medicare Annual Wellness Visit  Never done   HPV Vaccine (1 - 2-dose series) Never done   HIV Screening  Never done   Hepatitis C Screening: USPSTF Recommendation to screen - Ages 37-79 yo.  Never done   Pap Smear  Never done   Pap Smear  Never done   DTaP/Tdap/Td vaccine (7 - Td or Tdap) 11/04/2018   COVID-19 Vaccine (2 - Moderna risk series) 11/29/2021   Flu Shot  07/07/2022*  *Topic was postponed. The date shown is not the original due date.    Advanced directives: Advance directive discussed with you today. I have provided a copy for you to complete at home and have notarized. Once this is complete please bring a copy in to our office so we can scan it into your chart.   Conditions/risks identified: Aim for 30 minutes of exercise or brisk walking, 6-8 glasses of water, and 5 servings of fruits and vegetables each day.   Next appointment: Follow up in one year for your annual wellness visit.   Schedule female exam with Hoyle Sauer for the end of July/early August   Preventive Care 13-57 Years Old, Female Preventive care refers to lifestyle choices and visits with your health care provider that can promote health and wellness. Preventive care visits are also called wellness exams. What can I expect for my preventive care visit? Counseling During your preventive care visit, your health care provider may ask about your: Medical history, including: Past medical problems. Family medical history. Pregnancy history. Current health, including: Menstrual cycle. Method of birth  control. Emotional well-being. Home life and relationship well-being. Sexual activity and sexual health. Lifestyle, including: Alcohol, nicotine or tobacco, and drug use. Access to firearms. Diet, exercise, and sleep habits. Work and work Statistician. Sunscreen use. Safety issues such as seatbelt and bike helmet use. Physical exam Your health care provider may check your: Height and weight. These may be used to calculate your BMI (body mass index). BMI is a measurement that tells if you are at a healthy weight. Waist circumference. This measures the distance around your waistline. This measurement also tells if you are at a healthy weight and may help predict your risk of certain diseases, such as type 2 diabetes and high blood pressure. Heart rate and blood pressure. Body temperature. Skin for abnormal spots. What immunizations do I need? Vaccines are usually given at various ages, according to a schedule. Your health care provider will recommend vaccines for you based on your age, medical history, and lifestyle or other factors, such as travel or where you work. What tests do I need? Screening Your health care provider may recommend screening tests for certain conditions. This may include: Pelvic exam and Pap test. Lipid and cholesterol levels. Diabetes screening. This is done by checking your blood sugar (glucose) after you have not eaten for a while (fasting). Hepatitis B test. Hepatitis C test. HIV (human immunodeficiency virus) test. STI (sexually transmitted infection) testing, if you are at risk. BRCA-related cancer screening. This may be  done if you have a family history of breast, ovarian, tubal, or peritoneal cancers. Talk with your health care provider about your test results, treatment options, and if necessary, the need for more tests. Follow these instructions at home: Eating and drinking  Eat a healthy diet that includes fresh fruits and vegetables, whole grains,  lean protein, and low-fat dairy products. Take vitamin and mineral supplements as recommended by your health care provider. Do not drink alcohol if: Your health care provider tells you not to drink. You are pregnant, may be pregnant, or are planning to become pregnant. If you drink alcohol: Limit how much you have to 0-1 drink a day. Know how much alcohol is in your drink. In the [place] one drink equals one 12 oz bottle of beer (355 mL), one 5 oz glass of wine (148 mL), or one 1 oz glass of hard liquor (44 mL). Lifestyle Brush your teeth every morning and night with fluoride toothpaste. Floss one time each day. Exercise for at least 30 minutes 5 or more days each week. Do not use any products that contain nicotine or tobacco. These products include cigarettes, chewing tobacco, and vaping devices, such as e-cigarettes. If you need help quitting, ask your health care provider. Do not use drugs. If you are sexually active, practice safe sex. Use a condom or other form of protection to prevent STIs. If you do not wish to become pregnant, use a form of birth control. If you plan to become pregnant, see your health care provider for a prepregnancy visit. Find healthy ways to manage stress, such as: Meditation, yoga, or listening to music. Journaling. Talking to a trusted person. Spending time with friends and family. Minimize exposure to UV radiation to reduce your risk of skin cancer. Safety Always wear your seat belt while driving or riding in a vehicle. Do not drive: If you have been drinking alcohol. Do not ride with someone who has been drinking. If you have been using any mind-altering substances or drugs. While texting. When you are tired or distracted. Wear a helmet and other protective equipment during sports activities. If you have firearms in your house, make sure you follow all gun safety procedures. Seek help if you have been physically or sexually abused. What's next? Go to  your health care provider once a year for an annual wellness visit. Ask your health care provider how often you should have your eyes and teeth checked. Stay up to date on all vaccines. This information is not intended to replace advice given to you by your health care provider. Make sure you discuss any questions you have with your health care provider. Document Revised: 09/20/2020 Document Reviewed: 09/20/2020 Elsevier Patient Education  [place].

## 2023-03-03 ENCOUNTER — Ambulatory Visit (INDEPENDENT_AMBULATORY_CARE_PROVIDER_SITE_OTHER): Payer: Medicare Other | Admitting: Nurse Practitioner

## 2023-03-03 VITALS — BP 131/86 | Temp 98.2°F | Wt 188.0 lb

## 2023-03-03 DIAGNOSIS — J020 Streptococcal pharyngitis: Secondary | ICD-10-CM

## 2023-03-03 LAB — POCT RAPID STREP A (OFFICE): Rapid Strep A Screen: POSITIVE — AB

## 2023-03-03 MED ORDER — AZITHROMYCIN 250 MG PO TABS: ORAL_TABLET | ORAL | 0 refills | Status: DC

## 2023-03-03 NOTE — Progress Notes (Unsigned)
   Subjective:    Patient ID: Rebekah Haynes, female    DOB: 24-Oct-1995, 27 y.o.   MRN: [id number]  HPI  Patient arrives with cough and congestion since Friday.  Review of Systems     Objective:   Physical Exam        Assessment & Plan:

## 2023-03-04 ENCOUNTER — Encounter: Payer: Self-pay | Admitting: Nurse Practitioner

## 2023-06-10 ENCOUNTER — Ambulatory Visit (INDEPENDENT_AMBULATORY_CARE_PROVIDER_SITE_OTHER): Admitting: Nurse Practitioner

## 2023-06-10 VITALS — BP 136/89 | HR 96 | Temp 98.2°F | Ht 61.0 in | Wt 205.8 lb

## 2023-06-10 DIAGNOSIS — Z6838 Body mass index (BMI) 38.0-38.9, adult: Secondary | ICD-10-CM

## 2023-06-10 DIAGNOSIS — R635 Abnormal weight gain: Secondary | ICD-10-CM

## 2023-06-10 DIAGNOSIS — R632 Polyphagia: Secondary | ICD-10-CM | POA: Diagnosis not present

## 2023-06-10 DIAGNOSIS — F900 Attention-deficit hyperactivity disorder, predominantly inattentive type: Secondary | ICD-10-CM | POA: Diagnosis not present

## 2023-06-10 DIAGNOSIS — R5383 Other fatigue: Secondary | ICD-10-CM | POA: Diagnosis not present

## 2023-06-10 DIAGNOSIS — E669 Obesity, unspecified: Secondary | ICD-10-CM

## 2023-06-10 DIAGNOSIS — Z13220 Encounter for screening for lipoid disorders: Secondary | ICD-10-CM

## 2023-06-12 ENCOUNTER — Encounter: Payer: Self-pay | Admitting: Nurse Practitioner

## 2023-06-12 MED ORDER — LISDEXAMFETAMINE DIMESYLATE 10 MG PO CAPS: 10.0000 mg | ORAL_CAPSULE | Freq: Every day | ORAL | 0 refills | Status: DC

## 2023-06-12 NOTE — Progress Notes (Signed)
 Subjective:    Patient ID: Rebekah Haynes, female    DOB: 01-Aug-1995, 28 y.o.   MRN: [id number]  HPI Presents with her mother to discuss her recent weight gain.  Also note that she has ADD.  Has been treated in the past by Dr. Tenny Craw.  Has stopped all of her medications.  Remote history of taking a stimulant which did cause some side effects but has not taken this in years.  Diet wise she is a picky eater.  Tends to eat very little to almost nothing during the day and eats large quantities at supper.  Drinks mainly diet soda, rare regular soda or sugary beverage.  Tends to stay up late at night, going to sleep about 2:30 in the morning which she may have some snacking.  Gets up around 9 AM.  Currently looking for employment.  Most days she will get on the elliptical and ride for about 5 to 10 minutes.   Review of Systems  Constitutional:  Positive for fatigue.  HENT:  Negative for sore throat and trouble swallowing.   Respiratory:  Negative for cough, chest tightness, shortness of breath and wheezing.   Cardiovascular:  Negative for chest pain.   Social History   Tobacco Use   Smoking status: Never   Smokeless tobacco: Never  Vaping Use   Vaping status: Never Used  Substance Use Topics   Alcohol use: Never   Drug use: Never        Objective:   Physical Exam NAD.  Alert, oriented.  Calm cheerful affect.  Making good eye contact.  Lungs clear.  Heart regular rate rhythm.  Abdomen soft nondistended nontender.  Significant central obesity noted. Today's Vitals   06/10/23 1527  BP: 136/89  Pulse: 96  Temp: 98.2 F (36.8 C)  SpO2: 96%  Weight: 205 lb 12.8 oz (93.4 kg)  Height: 5\' 1"  (1.549 m)   Body mass index is 38.89 kg/m. Records indicate she has gained 17 pounds since November.     Assessment & Plan:   Problem List Items Addressed This Visit       Other   Attention deficit hyperactivity disorder (ADHD) - Primary   Relevant Orders   Ambulatory referral to  Psychiatry   Binge eating   Relevant Orders   Referral to Nutrition and Diabetes Services   Obesity (BMI 30-39.9)   Relevant Medications   lisdexamfetamine (VYVANSE) 10 MG capsule   Other Relevant Orders   Referral to Nutrition and Diabetes Services   Comprehensive metabolic panel   TSH   Other Visit Diagnoses       Excessive weight gain       Relevant Orders   TSH     Fatigue, unspecified type       Relevant Orders   CBC with Differential/Platelet   Comprehensive metabolic panel   TSH     Screening for lipid disorders       Relevant Orders   Lipid panel      Meds ordered this encounter  Medications   lisdexamfetamine (VYVANSE) 10 MG capsule    Sig: Take 1 capsule (10 mg total) by mouth daily.    Dispense:  30 capsule    Refill:  0    Supervising Provider:   Babs Sciara [phone]   Referral sent to resume care with Dr. Tenny Craw. Referral to local dietitian for dietary counseling. Routine lab work ordered. Agrees to a trial of Vyvanse low-dose 10 mg daily.  Discontinue  medication and contact office if any adverse effects.  These were reviewed with patient and her mother. Encouraged small frequent meals.  Tried to include healthy foods is much as possible.  Encouraged her to slowly increase the amount of time she is on the elliptical daily. Return in about 1 month (around 07/11/2023).

## 2023-06-23 ENCOUNTER — Encounter: Payer: Self-pay | Admitting: Nurse Practitioner

## 2023-07-10 ENCOUNTER — Encounter: Payer: Self-pay | Admitting: Nurse Practitioner

## 2023-07-10 LAB — CBC WITH DIFFERENTIAL/PLATELET
Basophils Absolute: 0 10*3/uL (ref 0.0–0.2)
Basos: 1 %
EOS (ABSOLUTE): 0.2 10*3/uL (ref 0.0–0.4)
Eos: 3 %
Hematocrit: 40.3 % (ref 34.0–46.6)
Hemoglobin: 13.3 g/dL (ref 11.1–15.9)
Immature Grans (Abs): 0 10*3/uL (ref 0.0–0.1)
Immature Granulocytes: 0 %
Lymphocytes Absolute: 2.4 10*3/uL (ref 0.7–3.1)
Lymphs: 33 %
MCH: 28.1 pg (ref 26.6–33.0)
MCHC: 33 g/dL (ref 31.5–35.7)
MCV: 85 fL (ref 79–97)
Monocytes Absolute: 0.5 10*3/uL (ref 0.1–0.9)
Monocytes: 7 %
Neutrophils Absolute: 4 10*3/uL (ref 1.4–7.0)
Neutrophils: 56 %
Platelets: 456 10*3/uL — ABNORMAL HIGH (ref 150–450)
RBC: 4.74 x10E6/uL (ref 3.77–5.28)
RDW: 12.9 % (ref 11.7–15.4)
WBC: 7.1 10*3/uL (ref 3.4–10.8)

## 2023-07-10 LAB — LIPID PANEL
Chol/HDL Ratio: 5.3 ratio — ABNORMAL HIGH (ref 0.0–4.4)
Cholesterol, Total: 197 mg/dL (ref 100–199)
HDL: 37 mg/dL — ABNORMAL LOW (ref >39)
LDL Chol Calc (NIH): 120 mg/dL — ABNORMAL HIGH (ref 0–99)
Triglycerides: 226 mg/dL — ABNORMAL HIGH (ref 0–149)
VLDL Cholesterol Cal: 40 mg/dL (ref 5–40)

## 2023-07-10 LAB — COMPREHENSIVE METABOLIC PANEL WITH GFR
ALT: 30 IU/L (ref 0–32)
AST: 19 IU/L (ref 0–40)
Albumin: 4.5 g/dL (ref 4.0–5.0)
Alkaline Phosphatase: 66 IU/L (ref 44–121)
BUN/Creatinine Ratio: 18 (ref 9–23)
BUN: 11 mg/dL (ref 6–20)
Bilirubin Total: 0.3 mg/dL (ref 0.0–1.2)
CO2: 20 mmol/L (ref 20–29)
Calcium: 9.2 mg/dL (ref 8.7–10.2)
Chloride: 102 mmol/L (ref 96–106)
Creatinine, Ser: 0.61 mg/dL (ref 0.57–1.00)
Globulin, Total: 2.8 g/dL (ref 1.5–4.5)
Glucose: 102 mg/dL — ABNORMAL HIGH (ref 70–99)
Potassium: 4.6 mmol/L (ref 3.5–5.2)
Sodium: 137 mmol/L (ref 134–144)
Total Protein: 7.3 g/dL (ref 6.0–8.5)
eGFR: 126 mL/min/{1.73_m2} (ref >59)

## 2023-07-10 LAB — TSH: TSH: 1.27 u[IU]/mL (ref 0.450–4.500)

## 2023-07-16 ENCOUNTER — Ambulatory Visit
Admission: EM | Admit: 2023-07-16 | Discharge: 2023-07-16 | Disposition: A | Discharge status: Home / Self Care | Attending: Nurse Practitioner | Admitting: Nurse Practitioner

## 2023-07-16 ENCOUNTER — Encounter: Payer: Self-pay | Admitting: Emergency Medicine

## 2023-07-16 ENCOUNTER — Other Ambulatory Visit: Payer: Self-pay

## 2023-07-16 ENCOUNTER — Ambulatory Visit (INDEPENDENT_AMBULATORY_CARE_PROVIDER_SITE_OTHER)

## 2023-07-16 ENCOUNTER — Ambulatory Visit: Admitting: Registered"

## 2023-07-16 DIAGNOSIS — S93401A Sprain of unspecified ligament of right ankle, initial encounter: Secondary | ICD-10-CM

## 2023-07-16 NOTE — Discharge Instructions (Signed)
 I will contact you later today if the results of the x-ray show any broken or fractured bones.  If you do not hear from [place], that means the x-ray is normal.  In the meantime, we will treat for an ankle sprain.  Recommend rest, ice, compression, elevation.  You can take Tylenol ibuprofen as needed for pain.  Seek care if symptoms do not improve with treatment.

## 2023-07-16 NOTE — ED Triage Notes (Signed)
 Pt reports right ankle pain since Sunday after try to corral goats. Pt reports stepped on some brush and tried to use a fence for support but reports fence gave way.   Pt has been using ibuprofen and elevation with minimal change in pain with ambulation. No obvious deformity noted.

## 2023-07-16 NOTE — ED Provider Notes (Addendum)
 [facility]    CSN: [id number] Arrival date & time: 07/16/23  1353      History   Chief Complaint Chief Complaint  Patient presents with   Ankle Pain    HPI Rebekah Haynes is a 28 y.o. female.   Patient presents today with mom for 3-day history of right ankle pain after falling while trying to take care of baby goats over the weekend.  She denies any known swelling, bruising, or redness.  Reports it is painful when she walks and she is having pain to multiple areas of her ankle and her heel.  No numbness or tingling in the toes.  Has tried ibuprofen, elevation, ice, and an Ace wrap for a few hours without much improvement.    Past Medical History:  Diagnosis Date   ADHD (attention deficit hyperactivity disorder)    Autistic spectrum disorder    MRSA (methicillin resistant Staphylococcus aureus)    Reflux     Patient Active Problem List   Diagnosis Date Noted   Nausea vomiting and diarrhea 05/09/2022   Intertriginous candidiasis 12/04/2021   Binge eating 11/03/2021   Obesity (BMI 30-39.9) 11/03/2021   Musculoskeletal pain 08/15/2021   Attention deficit hyperactivity disorder (ADHD) 04/30/2018   Autistic spectrum disorder 04/30/2018    Past Surgical History:  Procedure Laterality Date   MASTOIDECTOMY      OB History   No obstetric history on file.      Home Medications    Prior to Admission medications   Medication Sig Start Date End Date Taking? Authorizing Provider  lisdexamfetamine (VYVANSE) 10 MG capsule Take 1 capsule (10 mg total) by mouth daily. 06/12/23   Campbell Riches, NP    Family History Family History  Problem Relation Age of Onset   Healthy Mother    Alcohol abuse Father    Drug abuse Father     Social History Social History   Tobacco Use   Smoking status: Never   Smokeless tobacco: Never  Vaping Use   Vaping status: Never Used  Substance Use Topics   Alcohol use: Never   Drug use: Never     Allergies    Clindamycin/lincomycin   Review of Systems Review of Systems Per HPI  Physical Exam Triage Vital Signs ED Triage Vitals  Encounter Vitals Group     BP 07/16/23 1420 (!) 133/93     Systolic BP Percentile --      Diastolic BP Percentile --      Pulse Rate 07/16/23 1420 85     Resp 07/16/23 1420 20     Temp 07/16/23 1420 98.8 F (37.1 C)     Temp Source 07/16/23 1420 Oral     SpO2 07/16/23 1420 97 %     Weight --      Height --      Head Circumference --      Peak Flow --      Pain Score 07/16/23 1419 3     Pain Loc --      Pain Education --      Exclude from Growth Chart --    No data found.  Updated Vital Signs BP (!) 133/93 (BP Location: Right Arm)   Pulse 85   Temp 98.8 F (37.1 C) (Oral)   Resp 20   LMP 06/30/2023 (Approximate)   SpO2 97%   Visual Acuity Right Eye Distance:   Left Eye Distance:   Bilateral Distance:    Right Eye Near:  Left Eye Near:    Bilateral Near:     Physical Exam Vitals and nursing note reviewed.  Constitutional:      General: She is not in acute distress.    Appearance: Normal appearance. She is not toxic-appearing.  HENT:     Mouth/Throat:     Mouth: Mucous membranes are moist.     Pharynx: Oropharynx is clear.  Pulmonary:     Effort: Pulmonary effort is normal. No respiratory distress.  Musculoskeletal:     Comments: Inspection: no swelling, bruising, obvious deformity or redness to right ankle Palpation: tender to palpation right medial and lateral malleoli diffusely; there is also tenderness to palpation around the Achilles tendon; no obvious deformities palpated and Achilles tendon was palpated the entire length without any deficit appreciated ROM: Full ROM to right foot, ankle Strength: 5/5 bilateral lower extremities Neurovascular: neurovascularly intact in distal bilateral lower extremities  Skin:    General: Skin is warm and dry.     Capillary Refill: Capillary refill takes less than 2 seconds.     Coloration:  Skin is not jaundiced or pale.     Findings: No erythema.  Neurological:     Mental Status: She is alert and oriented to person, place, and time.  Psychiatric:        Behavior: Behavior is cooperative.      UC Treatments / Results  Labs (all labs ordered are listed, but only abnormal results are displayed) Labs Reviewed - No data to display  EKG   Radiology DG Ankle Complete Right Result Date: 07/16/2023 CLINICAL DATA:  Right ankle and heel pain. No history of trauma reported EXAM: RIGHT ANKLE - COMPLETE 3 VIEW COMPARISON:  Calcaneus x-ray 09/07/2007 FINDINGS: No fracture or dislocation. Preserved joint spaces and bone mineralization. Minimal Achilles calcaneal spur. IMPRESSION: No acute osseous abnormality. Electronically Signed   By: Karen Kays M.D.   On: 07/16/2023 16:42    Procedures Procedures (including critical care time)  Medications Ordered in UC Medications - No data to display  Initial Impression / Assessment and Plan / UC Course  I have reviewed the triage vital signs and the nursing notes.  Pertinent labs & imaging results that were available during my care of the patient were reviewed by me and considered in my medical decision making (see chart for details).   Patient is well-appearing, normotensive, afebrile, not tachycardic, not tachypneic, oxygenating well on room air.    1. Sprain of right ankle, unspecified ligament, initial encounter X-ray imaging pending at time of discharge; upon my independent review, I do not appreciate any bony abnormality Suspect ankle sprain Recommended rest, ice, compression, elevation and Tylenol/ibuprofen as needed for pain control  Update: Ankle x-ray is negative for acute bony abnormality.  No change to treatment plan from earlier today.  The patient was given the opportunity to ask questions.  All questions answered to their satisfaction.  The patient is in agreement to this plan.   Final Clinical Impressions(s) / UC  Diagnoses   Final diagnoses:  Sprain of right ankle, unspecified ligament, initial encounter     Discharge Instructions      I will contact you later today if the results of the x-ray show any broken or fractured bones.  If you do not hear from [place], that means the x-ray is normal.  In the meantime, we will treat for an ankle sprain.  Recommend rest, ice, compression, elevation.  You can take Tylenol ibuprofen as needed for pain.  Seek care if symptoms do not improve with treatment.     ED Prescriptions   None    PDMP not reviewed this encounter.   Valentino Nose, NP 07/16/23 1516    Valentino Nose, NP 07/16/23 1721

## 2023-07-17 ENCOUNTER — Encounter: Payer: Self-pay | Admitting: Nurse Practitioner

## 2023-07-17 ENCOUNTER — Ambulatory Visit: Admitting: Nurse Practitioner

## 2023-07-17 VITALS — BP 136/88 | HR 84 | Temp 98.1°F | Ht 61.0 in | Wt 195.0 lb

## 2023-07-17 DIAGNOSIS — Z23 Encounter for immunization: Secondary | ICD-10-CM

## 2023-07-17 DIAGNOSIS — S80811D Abrasion, right lower leg, subsequent encounter: Secondary | ICD-10-CM

## 2023-07-17 DIAGNOSIS — F900 Attention-deficit hyperactivity disorder, predominantly inattentive type: Secondary | ICD-10-CM

## 2023-07-17 DIAGNOSIS — S93401D Sprain of unspecified ligament of right ankle, subsequent encounter: Secondary | ICD-10-CM | POA: Diagnosis not present

## 2023-07-17 MED ORDER — AMPHETAMINE-DEXTROAMPHET ER 10 MG PO CP24: 10.0000 mg | ORAL_CAPSULE | Freq: Every day | ORAL | 0 refills | Status: DC

## 2023-07-17 NOTE — Progress Notes (Signed)
 Subjective:    Patient ID: Rebekah Haynes, female    DOB: [birth year], 28 y.o.   MRN: [id number]  HPI Presents with her mother for recheck on her ADHD.  Insurance would not cover Vyvanse.  She is interested in trying a different medication.  Patient is doing much better with her diet.  States she has a protein bar during the day and is eating smaller portions at suppertime and then a light snack at night.  Was riding her elliptical on a regular basis but sprained her ankle a few days ago.  Was seen at urgent care.  States she has been wrapping the ankle but wanted to take this off before she came to the visit.   Review of Systems  Respiratory:  Negative for cough, chest tightness and shortness of breath.   Cardiovascular:  Negative for chest pain.  Musculoskeletal:  Positive for gait problem.       Difficulty ambulating due to right ankle pain.  Psychiatric/Behavioral:  Positive for decreased concentration.        Objective:   Physical Exam NAD.  Alert, oriented.  Calm cheerful affect.  Lungs clear.  Heart regular rate rhythm.  Right ankle mild edema.  No erythema or warmth.  No skin changes.  Limited ROM of the ankle due to tenderness.  No joint laxity noted.  Is able to put weight on the ankle while ambulating.  A superficial approximately 6 cm abrasion is noted on the right lower leg.  No evidence of infection.  No recent tetanus vaccine. Today's Vitals   07/17/23 0947 07/17/23 0948  BP: (!) 129/90 136/88  Pulse: 84   Temp: 98.1 F (36.7 C)   SpO2: 97%   Weight: 195 lb (88.5 kg)   Height: 5\' 1"  (1.549 m)    Body mass index is 36.84 kg/m.      Xray 07/16/2023 CLINICAL DATA:  Right ankle and heel pain. No history of trauma reported   EXAM: RIGHT ANKLE - COMPLETE 3 VIEW   COMPARISON:  Calcaneus x-ray 09/07/2007   FINDINGS: No fracture or dislocation. Preserved joint spaces and bone mineralization. Minimal Achilles calcaneal spur.   IMPRESSION: No acute osseous  abnormality.     Assessment & Plan:   Problem List Items Addressed This Visit       Other   Attention deficit hyperactivity disorder (ADHD) - Primary   Other Visit Diagnoses       Sprain of right ankle, unspecified ligament, subsequent encounter         Abrasion, right lower leg, subsequent encounter       Relevant Orders   Tdap vaccine greater than or equal to 7yo IM (Completed)     Immunization due       Relevant Orders   Tdap vaccine greater than or equal to 7yo IM (Completed)      Meds ordered this encounter  Medications   amphetamine-dextroamphetamine (ADDERALL XR) 10 MG 24 hr capsule    Sig: Take 1 capsule (10 mg total) by mouth daily.    Dispense:  30 capsule    Refill:  0    Supervising Provider:   Lilyan Punt A [[id number]]   Tdap vaccine today due to wound on her right leg. Given prescription for Velcro ankle brace since she may tolerate this better.  Reviewed ankle exercises for her to perform when she is sitting down. Continue OTC analgesics for discomfort.  Call back in 7 to 10 days if not significantly better.  Discussed options for ADHD.  Trial of Adderall XR 10 mg daily.  After 1 month may need to titrate dose if needed and tolerated.  Family to get back to the office around that time to let us know how she is doing.  Discontinue medication in the meantime if any significant adverse effects. Return in about 3 months (around 10/16/2023).

## 2023-07-21 ENCOUNTER — Other Ambulatory Visit: Payer: Self-pay | Admitting: Nurse Practitioner

## 2023-07-21 ENCOUNTER — Encounter: Payer: Self-pay | Admitting: Nurse Practitioner

## 2023-07-21 MED ORDER — METHYLPHENIDATE HCL ER 10 MG PO TBCR: 10.0000 mg | EXTENDED_RELEASE_TABLET | Freq: Every day | ORAL | 0 refills | Status: DC

## 2023-08-26 ENCOUNTER — Ambulatory Visit: Admitting: Registered"

## 2023-09-29 ENCOUNTER — Encounter (HOSPITAL_COMMUNITY): Payer: Self-pay | Admitting: Registered Nurse

## 2023-09-29 ENCOUNTER — Ambulatory Visit (INDEPENDENT_AMBULATORY_CARE_PROVIDER_SITE_OTHER): Admitting: Registered Nurse

## 2023-09-29 VITALS — BP 119/79 | HR 88 | Ht 61.0 in | Wt 180.4 lb

## 2023-09-29 DIAGNOSIS — F840 Autistic disorder: Secondary | ICD-10-CM | POA: Diagnosis not present

## 2023-09-29 DIAGNOSIS — Z79899 Other long term (current) drug therapy: Secondary | ICD-10-CM

## 2023-09-29 DIAGNOSIS — F411 Generalized anxiety disorder: Secondary | ICD-10-CM

## 2023-09-29 DIAGNOSIS — F330 Major depressive disorder, recurrent, mild: Secondary | ICD-10-CM

## 2023-09-29 DIAGNOSIS — F902 Attention-deficit hyperactivity disorder, combined type: Secondary | ICD-10-CM | POA: Diagnosis not present

## 2023-09-29 MED ORDER — BUPROPION HCL ER (XL) 150 MG PO TB24: 150.0000 mg | ORAL_TABLET | Freq: Every day | ORAL | 0 refills | Status: DC

## 2023-09-29 MED ORDER — HYDROXYZINE HCL 10 MG PO TABS: ORAL_TABLET | ORAL | 0 refills | Status: DC

## 2023-09-29 NOTE — Progress Notes (Signed)
 Psychiatric Initial Adult Assessment   Patient Identification: Rebekah Haynes MRN:  [id number] Date of Evaluation:  09/29/2023 Referral Source: Mauro Elveria BROCKS, NP [facility] Chief Complaint:   Chief Complaint  Patient presents with   Establish Care    Medication management   Visit Diagnosis:    ICD-10-CM   1. On psychotropic medication  Z79.899 Prolactin    Magnesium    Pregnancy, urine    HgB A1c    CANCELED: HgB A1c    2. Attention deficit hyperactivity disorder (ADHD), combined type  F90.2 buPROPion (WELLBUTRIN XL) 150 MG 24 hr tablet    3. Autism spectrum disorder  F84.0     4. GAD (generalized anxiety disorder)  F41.1 hydrOXYzine (ATARAX) 10 MG tablet    5. Major depressive disorder, recurrent, mild (HCC)  F33.0 buPROPion (WELLBUTRIN XL) 150 MG 24 hr tablet      History of Present Illness:  Rebekah Haynes 28 y.o. female presents to office today accompanied by her mother Rebekah Haynes) to establish care for medication management.  She is seen face to face visit by this provider, and chart reviewed on 09/29/23.  She gives permission for her mother to sit in on assessment for collateral information.  Her psychiatric history is significant for autism spectrum disorder, ADHD, anxiety, and depression..  Her mental health is not currently managed with medication.  She and her mother reports she has tried Strattera  but caused headaches, Concerta  that did not do much for her, Vyvanse , Intuniv , Adderall which caused a severe adverse reaction I was only on it for short while.  It did not go well at all.  When I first took it felt like I wanted to drop like a rock then there was pain in my entire body, and I lost the ability to taste spicy foods and my lips started tingling.  She denies a prior history of suicide attempt, self injures behavior, psychiatric hospitalization, substance abuse, and abuse/neglect.  Current stressors are Will be going to a dorm like room where  she will have her own room living by herself for the first time.  I have a nephew in his terrible 2's.  She reports she also has a sensory problem (vision sometimes looks grainy, sunlight hurts her eyes and causes headaches) reports she is eating without difficulty and denies a history of binge eating.  Also reports she is sleeping without difficulty but hates being up during the day related to the son, fluorescent lighting, bright lights hurting her eyes.  She reports she is currently living with her mother and stepfather both are supportive.  Today she denies suicidal/self-harm/homicidal ideations, psychosis, paranoia, mood swings, and abnormal movements.  PHQ 2/9, C-SSRS, GAD 7, AUDIT, AIMS, nutrition, and pain screenings conducted during today's visit, see scores below.  Recommended the following: Wellbutrin XR 150 mg daily and Vistaril 10-20 mg 3 times daily as needed..  She and her mother is Informed of side effect/efficacy profile on Wellbutrin XR and Vistaril.  They are both informed that usually takes a couple of weeks before notable improvements are seen.  Zalma and her mother both voices understanding/agreement with information/recommendations being given to them today.    Associated Signs/Symptoms: Depression Symptoms:  depressed mood, difficulty concentrating, (Hypo) Manic Symptoms:  Impulsivity, Anxiety Symptoms:  Excessive Worry, Social Anxiety, Psychotic Symptoms:  Denies PTSD Symptoms: NA  Past Psychiatric History: Autism spectrum disorder, depression, anxiety, and ADHD.  Also reports sensory problems (vision and hearing)  Previous Psychotropic Medications: Yes  Substance Abuse History in the last 12 months:  No.  Consequences of Substance Abuse: NA  Past Medical History:  Past Medical History:  Diagnosis Date   ADHD (attention deficit hyperactivity disorder)    Anxiety    Autism spectrum disorder    Autistic spectrum disorder    MRSA (methicillin resistant  Staphylococcus aureus)    Reflux     Past Surgical History:  Procedure Laterality Date   MASTOIDECTOMY      Family Psychiatric History: See below and family history  Family History:  Family History  Problem Relation Age of Onset   Anxiety disorder Mother    Arthritis Mother    Alcohol abuse Father    Drug abuse Father     Social History:   Social History   Socioeconomic History   Marital status: Single    Spouse name: Not on file   Number of children: 0   Years of education: 12   Highest education level: 12th grade  Occupational History   Not on file  Tobacco Use   Smoking status: Never   Smokeless tobacco: Never  Vaping Use   Vaping status: Never Used  Substance and Sexual Activity   Alcohol use: Never   Drug use: Never   Sexual activity: Never  Other Topics Concern   Not on file  Social History Narrative   Not on file   Social Drivers of Health   Financial Resource Strain: Low Risk  (06/28/2022)   Overall Financial Resource Strain (CARDIA)    Difficulty of Paying Living Expenses: Not hard at all  Food Insecurity: No Food Insecurity (06/28/2022)   Hunger Vital Sign    Worried About Running Out of Food in the Last Year: Never true    Ran Out of Food in the Last Year: Never true  Transportation Needs: No Transportation Needs (06/28/2022)   PRAPARE - Administrator, Civil Service (Medical): No    Lack of Transportation (Non-Medical): No  Physical Activity: Inactive (06/28/2022)   Exercise Vital Sign    Days of Exercise per Week: 0 days    Minutes of Exercise per Session: 0 min  Stress: No Stress Concern Present (06/28/2022)   [employer] of Occupational Health - Occupational Stress Questionnaire    Feeling of Stress : Not at all  Social Connections: Moderately Integrated (06/28/2022)   Social Connection and Isolation Panel    Frequency of Communication with Friends and Family: More than three times a week    Frequency of Social Gatherings  with Friends and Family: More than three times a week    Attends Religious Services: 1 to 4 times per year    Active Member of [employer] or Organizations: Yes    Attends Banker Meetings: 1 to 4 times per year    Marital Status: Never married    Allergies:   Allergies  Allergen Reactions   Clindamycin/Lincomycin     Metabolic Disorder Labs: Most recent labs reviewed Lab Orders         Prolactin         Magnesium         Pregnancy, urine         HgB A1c     Lab Results  Component Value Date   CHOL 197 07/09/2023   TRIG 226 (H) 07/09/2023   HDL 37 (L) 07/09/2023   CHOLHDL 5.3 (H) 07/09/2023   LDLCALC 120 (H) 07/09/2023   LDLCALC 137 (H) 10/30/2021   Lab  Results  Component Value Date   TSH 1.270 07/09/2023   Current Medications: Current Outpatient Medications  Medication Sig Dispense Refill   buPROPion (WELLBUTRIN XL) 150 MG 24 hr tablet Take 1 tablet (150 mg total) by mouth daily. 30 tablet 0   hydrOXYzine (ATARAX) 10 MG tablet Take 10-20 mg (1-2 tablets) three times daily as needed 30 tablet 0   methylphenidate  10 MG ER tablet Take 1 tablet (10 mg total) by mouth daily. 30 tablet 0   No current facility-administered medications for this visit.    Musculoskeletal: Strength & Muscle Tone: within normal limits Gait & Station: normal Patient leans: N/A  Psychiatric Specialty Exam: Review of Systems  Blood pressure 119/79, pulse 88, height 5' 1 (1.549 m), weight 180 lb 6.4 oz (81.8 kg), SpO2 97%.Body mass index is 34.09 kg/m.  General Appearance: Casual  Eye Contact:  Good  Speech:  Clear and Coherent  Volume:  Normal  Mood:  Euthymic  Affect:  Congruent  Thought Process:  Coherent, Goal Directed, and Descriptions of Associations: Intact  Orientation:  Full (Time, Place, and Person)  Thought Content:  Logical  Suicidal Thoughts:  No  Homicidal Thoughts:  No  Memory:  Immediate;   Good Recent;   Good Remote;   Good  Judgement:  Intact  Insight:   Present  Psychomotor Activity:  Normal  Concentration:  Concentration: Good and Attention Span: Good  Recall:  Good  Fund of Knowledge:Good  Language: Good  Akathisia:  No  Handed:  Right  AIMS (if indicated):  done  Assets:  Communication Skills Desire for Improvement Financial Resources/Insurance Housing Leisure Time Physical Health Resilience Social Support Transportation  ADL's:  Intact  Cognition: WNL  Sleep:  Good   Screenings: AIMS    Flowsheet Row Office Visit from 09/29/2023 in [facility] [place]  AIMS Total Score 0   GAD-7    Flowsheet Row Office Visit from 09/29/2023 in [facility] [place] Office Visit from 05/09/2022 in [facility]  Total GAD-7 Score 8 2   PHQ2-9    Flowsheet Row Office Visit from 09/29/2023 in [facility] [place] Office Visit from 07/17/2023 in [facility] Clinical Support from 06/28/2022 in [facility] Office Visit from 05/17/2022 in [facility] Office Visit from 05/09/2022 in [facility]  PHQ-2 Total Score 0 0 0 0 1  PHQ-9 Total Score 4 0 0 -- 5   Flowsheet Row Office Visit from 09/29/2023 in [facility] [place] [facility] from 07/16/2023 in [facility] [place] [facility] from 04/10/2021 in [facility] [place]  C-SSRS RISK CATEGORY No Risk No Risk No Risk    Assessment and Plan:  Assessment: Patient seen and examined as noted above. Summary: Today Rebekah Haynes appears to be doing well.  Not currently taking any psychotropic medications for the management of her mental health.  She denies suicidal/self-harm/homicidal ideation, psychosis, paranoia, and fluctuations in mood.    During visit she is dressed appropriate for age and weather.  She is seated  comfortably in chair with no noted distress.  She is alert/oriented x 4, calm/cooperative and mood is congruent with affect.  She spoke in a clear tone at moderate volume, and normal pace, with good eye contact.  Her thought process is coherent, relevant, and there is no indication  that she is currently responding to internal/external stimuli or experiencing delusional thought content.    1. On psychotropic medication (Primary) - Prolactin - Magnesium - Pregnancy, urine - HgB A1c  2. Attention deficit hyperactivity disorder (ADHD), combined type - buPROPion (WELLBUTRIN XL) 150 MG 24 hr tablet; Take 1 tablet (150 mg total) by mouth daily.  Dispense: 30 tablet; Refill: 0  3. Autism spectrum disorder  4. GAD (generalized anxiety disorder) - hydrOXYzine (ATARAX) 10 MG tablet; Take 10-20 mg (1-2 tablets) three times daily as needed  Dispense: 30 tablet; Refill: 0  5. Major depressive disorder, recurrent, mild (HCC) - buPROPion (WELLBUTRIN XL) 150 MG 24 hr tablet; Take 1 tablet (150 mg total) by mouth daily.  Dispense: 30 tablet; Refill: 0  Plan: Medications: Meds ordered this encounter  Medications   buPROPion (WELLBUTRIN XL) 150 MG 24 hr tablet    Sig: Take 1 tablet (150 mg total) by mouth daily.    Dispense:  30 tablet    Refill:  0    Supervising Provider:   ARFEEN, SYED T [[id number]]   hydrOXYzine (ATARAX) 10 MG tablet    Sig: Take 10-20 mg (1-2 tablets) three times daily as needed    Dispense:  30 tablet    Refill:  0    Supervising Provider:   CURRY PATERSON T [[id number]]    Labs: Reviewed most recent labs and lab orders placed.    Lab Orders         Prolactin         Magnesium         Pregnancy, urine         HgB A1c     Other:  Continue counseling/therapy.   Rebekah Haynes is instructed to call 911, [phone], mobile crisis, or present to the nearest emergency room should she experience any suicidal/homicidal ideation, auditory/visual/hallucinations, or detrimental worsening of her  mental health condition.   Rebekah Haynes and her mother participated in the development of this treatment plan and understanding/verbalized by both in agreement with plan as listed.  Follow Up: Return in 2 weeks for medication management Call in the interim for any side-effects, decompensation, questions, or problems  Collaboration of Care: Medication Management AEB medication assessment, started Wellbutrin XR and Vistaril and Other labs ordered  Patient/Guardian was advised Release of Information must be obtained prior to any record release in order to collaborate their care with an outside provider. Patient/Guardian was advised if they have not already done so to contact the registration department to sign all necessary forms in order for us  to release information regarding their care.   Consent: Patient/Guardian gives verbal consent for treatment and assignment of benefits for services provided during this visit. Patient/Guardian expressed understanding and agreed to proceed.   Rebekah Spoon, NP 6/23/20258:12 PM

## 2023-09-29 NOTE — Patient Instructions (Addendum)
 Labs have been ordered since it has been a while since you had any.  Labs are checked at least yearly and more frequently depending on prescribed medication.  Routine blood work is an important part of assessing a patient's overall health and to rule out any condition that is not psychiatric related.  Tests such as a complete blood count, metabolic panel, lipid panel, thyroid function tests, and hemoglobin A1c test (screening for diabetes) can help a psychiatrist understand the general medical health of a patient.  Blood work can help make informed decisions about a potential differential diagnosis and help understand other factors that may be the correct reason for the presentation.  In some cases, blood work can aid in the diagnosis, medication management, and/or treatment of your a mental health. Please have labs drawn prior to next scheduled visit.   Routine Labs checked at least yearly.  May need to be checked more often depending on prescribed medications.   CBC with Differential/Platelet    Comprehensive metabolic panel    Hemoglobin A1c    Magnesium    Ethanol    Urinalysis, Routine w reflex microscopic Urine, Clean Catch    Pregnancy, urine (females of child bearing age) POCT Urine Drug Screen:  If prescribed any controlled substance  Also recommend EKG prior to starting psychotropic medications as a baseline and periodically after starting psychotropic medications to monitor for prolong QTc which can indicate the presence of cardiac risk factors.  Studies have shown that some psychotropic medications can increase the risk of prolong QTc.  Please have your primary care provider to do EKG at your next scheduled visit and send results if not available on Epic.      Call 911, [phone], mobile crisis, or present to the nearest emergency room should you experience any suicidal/homicidal ideation, auditory/visual/hallucinations, or detrimental worsening of your mental health.  Mobile Crisis Response  Teams Listed by counties in vicinity of [facility] providers [facility] [phone] [facility] [phone] [facility] [phone] [facility] [phone] [place]                * [facility] [phone]                * [employer] [phone]  [facility] [phone] [facility] [employer]  [phone] * [employer] [phone]

## 2023-10-13 ENCOUNTER — Ambulatory Visit (HOSPITAL_COMMUNITY): Admitting: Registered Nurse

## 2023-10-23 ENCOUNTER — Ambulatory Visit: Admitting: Nurse Practitioner

## 2023-10-27 ENCOUNTER — Ambulatory Visit (INDEPENDENT_AMBULATORY_CARE_PROVIDER_SITE_OTHER): Admitting: Registered Nurse

## 2023-10-27 ENCOUNTER — Encounter (HOSPITAL_COMMUNITY): Payer: Self-pay | Admitting: Registered Nurse

## 2023-10-27 VITALS — BP 126/82 | HR 88 | Ht 60.0 in | Wt 179.4 lb

## 2023-10-27 DIAGNOSIS — F330 Major depressive disorder, recurrent, mild: Secondary | ICD-10-CM

## 2023-10-27 DIAGNOSIS — F902 Attention-deficit hyperactivity disorder, combined type: Secondary | ICD-10-CM | POA: Diagnosis not present

## 2023-10-27 DIAGNOSIS — F411 Generalized anxiety disorder: Secondary | ICD-10-CM

## 2023-10-27 MED ORDER — BUPROPION HCL ER (XL) 300 MG PO TB24: 300.0000 mg | ORAL_TABLET | Freq: Every day | ORAL | 1 refills | Status: DC

## 2023-10-27 MED ORDER — HYDROXYZINE HCL 10 MG PO TABS: ORAL_TABLET | ORAL | 1 refills | Status: DC

## 2023-10-27 NOTE — Patient Instructions (Signed)

## 2023-10-27 NOTE — Progress Notes (Signed)
 BH MD/PA/NP OP Progress Note  10/27/2023 3:05 PM Rebekah Haynes  MRN:  [id number]  Chief Complaint:  Chief Complaint  Patient presents with   Follow-up    Medication management   HPI: [facility] [address] [place], [place] [address] Call Penne Cook [name] 28 y.o. female presents to office today for medication management follow up.  She is seen face to face by this provider, and chart reviewed on 10/27/23.  Her psychiatric history is significant for autism spectrum disorder, ADHD, anxiety, and depression.  Her mental health is currently managed with Wellbutrin  XR 150 mg daily and Vistaril  10 mg to 20 mg 3 times daily as needed.  She reports that current medication regimen is somewhat managing her mental health.  She reports there has been a little improvement but not a big impact.  She reports she is eating and sleeping without any difficulty.  Today she denies suicidal/self-harm/homicidal ideation, psychosis, paranoia, and abnormal movements.  PHQ 2/9, C-SSRS, GAD 7, nutrition, and pain screenings conducted during today's visit, see scores below.  Recommended the following: Increase Wellbutrin  XL to 300 mg daily and continue Vistaril  10-20 mg 3 times daily as needed.  She voices understanding/ agreement with information/recommendations being given to her today.    Visit Diagnosis:    ICD-10-CM   1. Attention deficit hyperactivity disorder (ADHD), combined type  F90.2 buPROPion  (WELLBUTRIN  XL) 300 MG 24 hr tablet    2. Major depressive disorder, recurrent, mild (HCC)  F33.0 buPROPion  (WELLBUTRIN  XL) 300 MG 24 hr tablet    3. GAD (generalized anxiety disorder)  F41.1 hydrOXYzine  (ATARAX ) 10 MG tablet      Past Psychiatric History: See below her medical history  Past Medical History:  Past Medical History:  Diagnosis Date   ADHD (attention deficit hyperactivity disorder)    Anxiety    Autism spectrum disorder     Autistic spectrum disorder    Major depressive disorder    MRSA (methicillin resistant Staphylococcus aureus)    Reflux     Past Surgical History:  Procedure Laterality Date   MASTOIDECTOMY      Family Psychiatric History: See below and family history  Family History:  Family History  Problem Relation Age of Onset   Anxiety disorder Mother    Arthritis Mother    Alcohol abuse Father    Drug abuse Father     Social History:  Social History   Socioeconomic History   Marital status: Single    Spouse name: Not on file   Number of children: 0   Years of education: 12   Highest education level: 12th grade  Occupational History   Not on file  Tobacco Use   Smoking status: Never   Smokeless tobacco: Never  Vaping Use   Vaping status: Never Used  Substance and Sexual Activity   Alcohol use: Never   Drug use: Never   Sexual activity: Never  Other Topics Concern   Not on file  Social History Narrative   Not on file   Social Drivers of Health   Financial Resource Strain: Low Risk  (06/28/2022)   Overall Financial Resource Strain (CARDIA)    Difficulty of Paying Living Expenses: Not hard at all  Food Insecurity: No Food Insecurity (06/28/2022)   Hunger Vital Sign    Worried About Running Out of Food in the Last Year: Never true    Ran Out of Food in the  Last Year: Never true  Transportation Needs: No Transportation Needs (06/28/2022)   PRAPARE - Administrator, Civil Service (Medical): No    Lack of Transportation (Non-Medical): No  Physical Activity: Inactive (06/28/2022)   Exercise Vital Sign    Days of Exercise per Week: 0 days    Minutes of Exercise per Session: 0 min  Stress: No Stress Concern Present (06/28/2022)   [employer] of Occupational Health - Occupational Stress Questionnaire    Feeling of Stress : Not at all  Social Connections: Moderately Integrated (06/28/2022)   Social Connection and Isolation Panel    Frequency of Communication  with Friends and Family: More than three times a week    Frequency of Social Gatherings with Friends and Family: More than three times a week    Attends Religious Services: 1 to 4 times per year    Active Member of [employer] or Organizations: Yes    Attends Banker Meetings: 1 to 4 times per year    Marital Status: Never married    Allergies:  Allergies  Allergen Reactions   Clindamycin/Lincomycin     Metabolic Disorder Labs: No results found for: HGBA1C, MPG No results found for: PROLACTIN Lab Results  Component Value Date   CHOL 197 07/09/2023   TRIG 226 (H) 07/09/2023   HDL 37 (L) 07/09/2023   CHOLHDL 5.3 (H) 07/09/2023   LDLCALC 120 (H) 07/09/2023   LDLCALC 137 (H) 10/30/2021   Lab Results  Component Value Date   TSH 1.270 07/09/2023   TSH 2.110 10/30/2021    Current Medications: Current Outpatient Medications  Medication Sig Dispense Refill   buPROPion  (WELLBUTRIN  XL) 300 MG 24 hr tablet Take 1 tablet (300 mg total) by mouth daily. 30 tablet 1   hydrOXYzine  (ATARAX ) 10 MG tablet Take 10-20 mg (1-2 tablets) three times daily as needed 30 tablet 1   methylphenidate  10 MG ER tablet Take 1 tablet (10 mg total) by mouth daily. (Patient not taking: Reported on 10/27/2023) 30 tablet 0   No current facility-administered medications for this visit.     Musculoskeletal: Strength & Muscle Tone: within normal limits Gait & Station: normal Patient leans: N/A  Psychiatric Specialty Exam: Review of Systems  Constitutional:        No other complaints voiced at this time.  Psychiatric/Behavioral:  Negative for agitation, decreased concentration, hallucinations, self-injury, sleep disturbance and suicidal ideas. Dysphoric mood: Stable.The patient is not hyperactive. Nervous/anxious: Stable.  All other systems reviewed and are negative.   Blood pressure 126/82, pulse 88, height 5' (1.524 m), weight 179 lb 6.4 oz (81.4 kg), SpO2 99%.Body mass index is 35.04  kg/m.  General Appearance: Casual  Eye Contact:  Good  Speech:  Clear and Coherent and Normal Rate  Volume:  Normal  Mood:  Euthymic  Affect:  Appropriate and Congruent  Thought Process:  Coherent, Goal Directed, and Descriptions of Associations: Intact  Orientation:  Full (Time, Place, and Person)  Thought Content: WDL and Logical   Suicidal Thoughts:  No  Homicidal Thoughts:  No  Memory:  Immediate;   Good Recent;   Good Remote;   Good  Judgement:  Intact  Insight:  Present  Psychomotor Activity:  Normal  Concentration:  Concentration: Good and Attention Span: Good  Recall:  Good  Fund of Knowledge: Good  Language: Good  Akathisia:  No  Handed:  Right  AIMS (if indicated): not done  Assets:  Communication Skills Desire for Improvement Financial Resources/Insurance  Housing Leisure Time Physical Health Resilience Social Support Transportation  ADL's:  Intact  Cognition: WNL  Sleep:  Good   Screenings: AIMS    Flowsheet Row Office Visit from 09/29/2023 in [facility] [place]  AIMS Total Score 0   GAD-7    Flowsheet Row Office Visit from 10/27/2023 in [facility] [place] Office Visit from 09/29/2023 in [facility] [place] Office Visit from 05/09/2022 in [facility]  Total GAD-7 Score 1 8 2    PHQ2-9    Flowsheet Row Office Visit from 10/27/2023 in [facility] [place] Office Visit from 09/29/2023 in [facility] [place] Office Visit from 07/17/2023 in [facility] Clinical Support from 06/28/2022 in [facility] Office Visit from 05/17/2022 in [facility]  PHQ-2 Total Score 0 0 0 0 0  PHQ-9 Total Score 2 4 0 0 --   Flowsheet Row Office Visit from 10/27/2023 in [facility] [place] Office Visit from 09/29/2023 in [facility] [place] [facility] from 07/16/2023 in [facility] RISK CATEGORY No Risk No Risk No Risk     Assessment and Plan:  Assessment: Patient seen and examined as noted above. Summary: Today Rebekah Haynes appears to be doing well.  She reports current medication regimen is somewhat managing her mental health but she has not noticed a big impact or change.  She denies adverse reactions to medications.  She reports she is eating and sleeping without any difficulty.  She denies suicidal/self-harm/homicidal ideation, psychosis, paranoia, and abnormal movements. During visit she is dressed appropriate for age and weather.  She is seated comfortably in chair with no noted distress.  She is alert/oriented x 4, calm/cooperative and mood is congruent with affect.  She spoke in a clear tone at moderate volume, and normal pace, with good eye contact.  Her thought process is coherent, relevant, and there is no indication that she is currently responding to internal/external stimuli or experiencing delusional thought content.    1. Attention deficit hyperactivity disorder (ADHD), combined type (Primary) - buPROPion  (WELLBUTRIN  XL) 300 MG 24 hr tablet; Take 1 tablet (300 mg total) by mouth daily.  Dispense: 30 tablet; Refill: 1  2. Major depressive disorder, recurrent, mild (HCC) - buPROPion  (WELLBUTRIN  XL) 300 MG 24 hr tablet; Take 1 tablet (300 mg total) by mouth daily.  Dispense: 30 tablet; Refill: 1  3. GAD (generalized anxiety disorder) - hydrOXYzine  (ATARAX ) 10 MG tablet; Take 10-20 mg (1-2 tablets) three times daily as needed  Dispense: 30 tablet; Refill: 1    Plan: Medications: Meds ordered this encounter  Medications   buPROPion  (WELLBUTRIN  XL) 300 MG 24 hr tablet    Sig: Take 1 tablet (300 mg total) by mouth daily.    Dispense:  30 tablet    Refill:  1    Supervising Provider:    CURRY, SYED T [[id number]]   hydrOXYzine  (ATARAX ) 10 MG tablet    Sig: Take 10-20 mg (1-2 tablets) three times daily as needed    Dispense:  30 tablet    Refill:  1    Supervising Provider:   CURRY PATERSON T [[id number]]    Labs:  Not indicated at this time  Other:  Continue counseling/therapy.   Rebekah Haynes is instructed to call  [phone], mobile crisis, or present to the nearest emergency room should she experience any suicidal/homicidal ideation, auditory/visual/hallucinations, or detrimental worsening of her mental health condition.   Rebekah Haynes participated in the development of this treatment plan and verbalized her understanding/agreement with plan as listed.  Follow Up: Return in 1 month for medication management Call in the interim for any side-effects, decompensation, questions, or problems  Collaboration of Care: Collaboration of Care: Medication Management AEB medication assessment, adjustment, and refills  Patient/Guardian was advised Release of Information must be obtained prior to any record release in order to collaborate their care with an outside provider. Patient/Guardian was advised if they have not already done so to contact the registration department to sign all necessary forms in order for us  to release information regarding their care.   Consent: Patient/Guardian gives verbal consent for treatment and assignment of benefits for services provided during this visit. Patient/Guardian expressed understanding and agreed to proceed.    Nita Whitmire, NP 10/27/2023, 3:05 PM

## 2023-12-01 ENCOUNTER — Ambulatory Visit (INDEPENDENT_AMBULATORY_CARE_PROVIDER_SITE_OTHER): Admitting: Registered Nurse

## 2023-12-01 ENCOUNTER — Encounter (HOSPITAL_COMMUNITY): Payer: Self-pay | Admitting: Registered Nurse

## 2023-12-01 VITALS — BP 121/84 | HR 85 | Ht 61.0 in | Wt 179.6 lb

## 2023-12-01 DIAGNOSIS — F902 Attention-deficit hyperactivity disorder, combined type: Secondary | ICD-10-CM

## 2023-12-01 DIAGNOSIS — F330 Major depressive disorder, recurrent, mild: Secondary | ICD-10-CM

## 2023-12-01 DIAGNOSIS — F411 Generalized anxiety disorder: Secondary | ICD-10-CM | POA: Diagnosis not present

## 2023-12-01 MED ORDER — METHYLPHENIDATE HCL ER (PM) 20 MG PO CP24: 20.0000 mg | ORAL_CAPSULE | Freq: Every day | ORAL | 0 refills | Status: AC

## 2023-12-01 MED ORDER — HYDROXYZINE HCL 10 MG PO TABS: ORAL_TABLET | ORAL | Status: AC

## 2023-12-01 MED ORDER — BUPROPION HCL ER (SR) 150 MG PO TB12: 150.0000 mg | ORAL_TABLET | Freq: Two times a day (BID) | ORAL | 1 refills | Status: AC

## 2023-12-01 NOTE — Patient Instructions (Signed)

## 2023-12-01 NOTE — Progress Notes (Signed)
 BH MD/PA/NP OP Progress Note  12/01/2023 7:31 PM Rebekah Haynes  MRN:  [id number]  Chief Complaint:  No chief complaint on file.  HPI: [facility] [name] [place] [address] Call Penne Cook [phone] R Bordeaux 28 y.o. female presents to office today accompanied by her mother for medication management follow up.  She is seen face to face by this provider, and chart reviewed on 12/01/23.  She gives permission for her mother to sit in during assessment and provide collateral information.  Her psychiatric history is significant for autism spectrum disorder, ADHD, anxiety, and depression.  Her mental health is currently managed with Wellbutrin  XR 300 mg daily and Vistaril  10-20 mg 3 times daily as needed.  She reports that things have been pretty good and feels that medications are effectively managing her mental health without adverse reaction.  She states that she has a difficulty time swallowing the Wellbutrin  and wants to go back to lower dosage since that pill was easier to follow.  Reports there has been improvement with the 300 mg of Wellbutrin .  She also reports improvement in concentration and focus but states has a harder time getting started in the morning.  She is supposed to return to school in January.  Suggested trying Jornay PM . Mother reports that she has only seen minimal improvement with the Wellbutrin  I look at how she does her chores and the speed at what she does.  Is she able to focus and do what is needed or if she has to have reminders and finishes one task before starting on another before finishes the first.  When she for gets to get things done she says she has syrup on the brain.   Today she denies suicidal/self-harm/homicidal ideation, psychosis, paranoia, and abnormal movements.  PHQ 2/9, GAD-7, AIMS nutrition, and pain screenings conducted during today's visit, see scores below    Recommended the  following:  Change Wellbutrin  150 mg twice daily, continue Vistaril  10-20 mg 3 times daily as needed, start Jornay PM  20 mg daily at 8 PM.  She and mother voice their understanding/agreement with information/recommendations being given to today.    Visit Diagnosis:    ICD-10-CM   1. Attention deficit hyperactivity disorder (ADHD), combined type  F90.2 buPROPion  (WELLBUTRIN  SR) 150 MG 12 hr tablet    methylphenidate  (JORNAY PM ) 20 MG 24 hr capsule    2. Major depressive disorder, recurrent, mild (HCC)  F33.0 buPROPion  (WELLBUTRIN  SR) 150 MG 12 hr tablet    3. GAD (generalized anxiety disorder)  F41.1 hydrOXYzine  (ATARAX ) 10 MG tablet     Past Psychiatric History: See below her medical history  Past Medical History:  Past Medical History:  Diagnosis Date   ADHD (attention deficit hyperactivity disorder)    Anxiety    Autism spectrum disorder    Autistic spectrum disorder    Major depressive disorder    MRSA (methicillin resistant Staphylococcus aureus)    Reflux     Past Surgical History:  Procedure Laterality Date   MASTOIDECTOMY      Family Psychiatric History: See below and family history  Family History:  Family History  Problem Relation Age of Onset   Anxiety disorder Mother    Arthritis Mother    Alcohol abuse Father    Drug abuse Father     Social History:  Social History   Socioeconomic History   Marital status: Single    Spouse name:  Not on file   Number of children: 0   Years of education: 12   Highest education level: 12th grade  Occupational History   Not on file  Tobacco Use   Smoking status: Never   Smokeless tobacco: Never  Vaping Use   Vaping status: Never Used  Substance and Sexual Activity   Alcohol use: Never   Drug use: Never   Sexual activity: Never  Other Topics Concern   Not on file  Social History Narrative   Not on file   Social Drivers of Health   Financial Resource Strain: Low Risk  (06/28/2022)   Overall Financial Resource  Strain (CARDIA)    Difficulty of Paying Living Expenses: Not hard at all  Food Insecurity: No Food Insecurity (06/28/2022)   Hunger Vital Sign    Worried About Running Out of Food in the Last Year: Never true    Ran Out of Food in the Last Year: Never true  Transportation Needs: No Transportation Needs (06/28/2022)   PRAPARE - Administrator, Civil Service (Medical): No    Lack of Transportation (Non-Medical): No  Physical Activity: Inactive (06/28/2022)   Exercise Vital Sign    Days of Exercise per Week: 0 days    Minutes of Exercise per Session: 0 min  Stress: No Stress Concern Present (06/28/2022)   [employer] of Occupational Health - Occupational Stress Questionnaire    Feeling of Stress : Not at all  Social Connections: Moderately Integrated (06/28/2022)   Social Connection and Isolation Panel    Frequency of Communication with Friends and Family: More than three times a week    Frequency of Social Gatherings with Friends and Family: More than three times a week    Attends Religious Services: 1 to 4 times per year    Active Member of [employer] or Organizations: Yes    Attends Banker Meetings: 1 to 4 times per year    Marital Status: Never married    Allergies:  Allergies  Allergen Reactions   Clindamycin/Lincomycin     Metabolic Disorder Labs: No results found for: HGBA1C, MPG No results found for: PROLACTIN Lab Results  Component Value Date   CHOL 197 07/09/2023   TRIG 226 (H) 07/09/2023   HDL 37 (L) 07/09/2023   CHOLHDL 5.3 (H) 07/09/2023   LDLCALC 120 (H) 07/09/2023   LDLCALC 137 (H) 10/30/2021   Lab Results  Component Value Date   TSH 1.270 07/09/2023   TSH 2.110 10/30/2021    Current Medications: Current Outpatient Medications  Medication Sig Dispense Refill   buPROPion  (WELLBUTRIN  SR) 150 MG 12 hr tablet Take 1 tablet (150 mg total) by mouth 2 (two) times daily. 60 tablet 1   methylphenidate  (JORNAY PM ) 20 MG 24 hr capsule  Take 1 capsule (20 mg total) by mouth daily at 8 pm. 30 capsule 0   hydrOXYzine  (ATARAX ) 10 MG tablet Take 10-20 mg (1-2 tablets) three times daily as needed     No current facility-administered medications for this visit.     Musculoskeletal: Strength & Muscle Tone: within normal limits Gait & Station: normal Patient leans: N/A  Psychiatric Specialty Exam: Review of Systems  Constitutional:        No other complaints voiced at this time.  Psychiatric/Behavioral:  Positive for decreased concentration (Improving). Negative for agitation, hallucinations, self-injury, sleep disturbance and suicidal ideas. Dysphoric mood: Stable.The patient is nervous/anxious (Stable). The patient is not hyperactive.   All other systems reviewed and  are negative.   Blood pressure 121/84, pulse 85, height 5' 1 (1.549 m), weight 179 lb 9.6 oz (81.5 kg), last menstrual period 11/28/2023, SpO2 98%.Body mass index is 33.94 kg/m.  General Appearance: Casual  Eye Contact:  Good  Speech:  Clear and Coherent and Normal Rate  Volume:  Normal  Mood:  Euthymic  Affect:  Appropriate and Congruent  Thought Process:  Coherent, Goal Directed, and Descriptions of Associations: Intact  Orientation:  Full (Time, Place, and Person)  Thought Content: Logical   Suicidal Thoughts:  No  Homicidal Thoughts:  No  Memory:  Immediate;   Good Recent;   Good Remote;   Good  Judgement:  Intact  Insight:  Present  Psychomotor Activity:  Normal  Concentration:  Concentration: Good and Attention Span: Good  Recall:  Good  Fund of Knowledge: Good  Language: Good  Akathisia:  No  Handed:  Right  AIMS (if indicated): not done  Assets:  Communication Skills Desire for Improvement Financial Resources/Insurance Housing Leisure Time Physical Health Resilience Social Support Transportation  ADL's:  Intact  Cognition: WNL  Sleep:  Good   Screenings: AIMS    Loss adjuster, chartered Office Visit from 09/29/2023 in [facility] [place]  AIMS Total Score 0   GAD-7    Flowsheet Row Office Visit from 12/01/2023 in [facility] [place] Office Visit from 10/27/2023 in [facility] [place] Office Visit from 09/29/2023 in [facility] [place] Office Visit from 05/09/2022 in [facility]  Total GAD-7 Score 1 1 8 2    PHQ2-9    Flowsheet Row Office Visit from 12/01/2023 in [facility] [place] Office Visit from 10/27/2023 in [facility] [place] Office Visit from 09/29/2023 in [facility] [place] Office Visit from 07/17/2023 in [facility] [place] [facility] Clinical Support from 06/28/2022 in [facility]  PHQ-2 Total Score 0 0 0 0 0  PHQ-9 Total Score 4 2 4  0 0   Flowsheet Row Office Visit from 12/01/2023 in [facility] [place] Office Visit from 10/27/2023 in [facility] [place] Office Visit from 09/29/2023 in [facility] [place]  C-SSRS RISK CATEGORY No Risk No Risk No Risk    Assessment and Plan:  Assessment: Patient seen and examined as noted above. Summary: Today Rebekah Haynes appears to be doing well.  She reports current medication regimen is effectively managing mental health but has a hard time swallowing the Wellbutrin  XL 300 mg tablet.  Mother reports minimal improvement of ADHD symptoms with Wellbutrin  and takes a while for it to work and for [employer] to get stated in the mornings.  She reports she is eating and sleeping without any difficulty.  She denies suicidal/self-harm/homicidal ideation, psychosis, paranoia, and abnormal movements. During visit she is dressed appropriate for age and weather.  She is seated comfortably  in chair with no noted distress.  She is alert/oriented x 4, calm/cooperative and mood is congruent with affect.  She spoke in a clear tone at moderate volume, and normal pace, with good eye contact.  Her thought process is coherent, relevant, and there is no indication that she is currently responding to internal/external stimuli or experiencing delusional thought content.    1. Attention deficit hyperactivity disorder (ADHD), combined type (Primary) - buPROPion  (  WELLBUTRIN  SR) 150 MG 12 hr tablet; Take 1 tablet (150 mg total) by mouth 2 (two) times daily.  Dispense: 60 tablet; Refill: 1 - methylphenidate  (JORNAY PM ) 20 MG 24 hr capsule; Take 1 capsule (20 mg total) by mouth daily at 8 pm.  Dispense: 30 capsule; Refill: 0  2. Major depressive disorder, recurrent, mild (HCC) - buPROPion  (WELLBUTRIN  SR) 150 MG 12 hr tablet; Take 1 tablet (150 mg total) by mouth 2 (two) times daily.  Dispense: 60 tablet; Refill: 1  3. GAD (generalized anxiety disorder) - hydrOXYzine  (ATARAX ) 10 MG tablet; Take 10-20 mg (1-2 tablets) three times daily as needed   Plan: Medications: Meds ordered this encounter  Medications   buPROPion  (WELLBUTRIN  SR) 150 MG 12 hr tablet    Sig: Take 1 tablet (150 mg total) by mouth 2 (two) times daily.    Dispense:  60 tablet    Refill:  1    Supervising Provider:   ARFEEN, SYED T [[id number]]   methylphenidate  (JORNAY PM ) 20 MG 24 hr capsule    Sig: Take 1 capsule (20 mg total) by mouth daily at 8 pm.    Dispense:  30 capsule    Refill:  0    Supervising Provider:   CURRY PATERSON T [[id number]]   hydrOXYzine  (ATARAX ) 10 MG tablet    Sig: Take 10-20 mg (1-2 tablets) three times daily as needed    Supervising Provider:   CURRY PATERSON T [[id number]]    Labs:  Not indicated at this time  Other:  Continue counseling/therapy.   Nataya R Lisenby is instructed to call 911, [phone], mobile crisis, or present to the nearest emergency room should she experience any suicidal/homicidal ideation,  auditory/visual/hallucinations, or detrimental worsening of her mental health condition.   Junelle R Biswas and mother participated in the development of this treatment plan and verbalized their understanding/agreement with plan as listed.  Follow Up: Return in 1 month for medication management Call in the interim for any side-effects, decompensation, questions, or problems  Collaboration of Care: Collaboration of Care: Medication Management AEB medication assessment, adjustment, and refills  Patient/Guardian was advised Release of Information must be obtained prior to any record release in order to collaborate their care with an outside provider. Patient/Guardian was advised if they have not already done so to contact the registration department to sign all necessary forms in order for us  to release information regarding their care.   Consent: Patient/Guardian gives verbal consent for treatment and assignment of benefits for services provided during this visit. Patient/Guardian expressed understanding and agreed to proceed.    Antionne Enrique, NP 12/01/2023, 7:31 PM

## 2024-01-05 ENCOUNTER — Ambulatory Visit (HOSPITAL_COMMUNITY): Admitting: Registered Nurse

## 2024-03-26 ENCOUNTER — Ambulatory Visit

## 2024-03-29 ENCOUNTER — Ambulatory Visit: Admitting: Physician Assistant

## 2024-03-29 ENCOUNTER — Telehealth (HOSPITAL_COMMUNITY): Admitting: Registered Nurse

## 2024-04-05 ENCOUNTER — Encounter

## 2024-07-09 ENCOUNTER — Ambulatory Visit
